# Patient Record
Sex: Female | Born: 1960 | Race: White | Hispanic: No | Marital: Married
Health system: Midwestern US, Community
[De-identification: ages and names within clinical notes are randomized; demographics above are authoritative.]

## PROBLEM LIST (undated history)

## (undated) DIAGNOSIS — Z01818 Encounter for other preprocedural examination: Secondary | ICD-10-CM

## (undated) DIAGNOSIS — T4145XA Adverse effect of unspecified anesthetic, initial encounter: Secondary | ICD-10-CM

## (undated) DIAGNOSIS — R51 Headache: Secondary | ICD-10-CM

## (undated) DIAGNOSIS — Z98811 Dental restoration status: Secondary | ICD-10-CM

## (undated) DIAGNOSIS — Z8489 Family history of other specified conditions: Secondary | ICD-10-CM

## (undated) DIAGNOSIS — T8859XA Other complications of anesthesia, initial encounter: Secondary | ICD-10-CM

## (undated) DIAGNOSIS — K219 Gastro-esophageal reflux disease without esophagitis: Secondary | ICD-10-CM

## (undated) DIAGNOSIS — R519 Headache, unspecified: Secondary | ICD-10-CM

## (undated) DIAGNOSIS — M7541 Impingement syndrome of right shoulder: Secondary | ICD-10-CM

## (undated) DIAGNOSIS — M1990 Unspecified osteoarthritis, unspecified site: Secondary | ICD-10-CM

## (undated) DIAGNOSIS — Z801 Family history of malignant neoplasm of trachea, bronchus and lung: Secondary | ICD-10-CM

## (undated) DIAGNOSIS — Z803 Family history of malignant neoplasm of breast: Secondary | ICD-10-CM

## (undated) DIAGNOSIS — M75100 Unspecified rotator cuff tear or rupture of unspecified shoulder, not specified as traumatic: Secondary | ICD-10-CM

## (undated) HISTORY — DX: Family history of malignant neoplasm of breast: Z80.3

## (undated) HISTORY — DX: Family history of malignant neoplasm of trachea, bronchus and lung: Z80.1

## (undated) HISTORY — PX: DIAGNOSTIC LAPAROSCOPY: SUR761

---

## 1993-09-14 HISTORY — PX: LUMBAR DISC SURGERY: SHX700

## 1993-09-14 HISTORY — PX: ABDOMINAL HYSTERECTOMY: SHX81

## 2001-08-25 ENCOUNTER — Encounter: Admission: RE | Admit: 2001-08-25 | Discharge: 2001-08-25 | Payer: Self-pay | Admitting: Family Medicine

## 2001-08-25 ENCOUNTER — Encounter: Payer: Self-pay | Admitting: Family Medicine

## 2002-09-04 ENCOUNTER — Encounter: Admission: RE | Admit: 2002-09-04 | Discharge: 2002-09-04 | Payer: Self-pay | Admitting: Family Medicine

## 2002-09-04 ENCOUNTER — Encounter: Payer: Self-pay | Admitting: Family Medicine

## 2003-04-10 ENCOUNTER — Other Ambulatory Visit: Admission: RE | Admit: 2003-04-10 | Discharge: 2003-04-10 | Payer: Self-pay | Admitting: Gynecology

## 2003-12-28 ENCOUNTER — Ambulatory Visit (HOSPITAL_COMMUNITY): Admission: RE | Admit: 2003-12-28 | Discharge: 2003-12-29 | Payer: Self-pay | Admitting: Neurosurgery

## 2003-12-28 HISTORY — PX: LUMBAR DISC SURGERY: SHX700

## 2004-04-24 ENCOUNTER — Other Ambulatory Visit: Admission: RE | Admit: 2004-04-24 | Discharge: 2004-04-24 | Payer: Self-pay | Admitting: Gynecology

## 2006-03-29 ENCOUNTER — Other Ambulatory Visit: Admission: RE | Admit: 2006-03-29 | Discharge: 2006-03-29 | Payer: Self-pay | Admitting: Gynecology

## 2006-12-14 ENCOUNTER — Encounter: Admission: RE | Admit: 2006-12-14 | Discharge: 2006-12-14 | Payer: Self-pay | Admitting: *Deleted

## 2007-03-09 ENCOUNTER — Ambulatory Visit (HOSPITAL_COMMUNITY): Admission: RE | Admit: 2007-03-09 | Discharge: 2007-03-09 | Payer: Self-pay | Admitting: Surgery

## 2007-03-09 ENCOUNTER — Encounter (INDEPENDENT_AMBULATORY_CARE_PROVIDER_SITE_OTHER): Payer: Self-pay | Admitting: Surgery

## 2007-03-09 HISTORY — PX: CHOLECYSTECTOMY: SHX55

## 2009-03-29 ENCOUNTER — Other Ambulatory Visit: Admission: RE | Admit: 2009-03-29 | Discharge: 2009-03-29 | Payer: Self-pay | Admitting: Family Medicine

## 2009-09-05 ENCOUNTER — Encounter: Admission: RE | Admit: 2009-09-05 | Discharge: 2009-09-05 | Payer: Self-pay | Admitting: Family Medicine

## 2010-11-03 ENCOUNTER — Other Ambulatory Visit: Payer: Self-pay | Admitting: Family Medicine

## 2010-11-03 DIAGNOSIS — Z1231 Encounter for screening mammogram for malignant neoplasm of breast: Secondary | ICD-10-CM

## 2010-11-26 ENCOUNTER — Ambulatory Visit
Admission: RE | Admit: 2010-11-26 | Discharge: 2010-11-26 | Disposition: A | Discharge status: Home / Self Care | Payer: BC Managed Care – PPO | Source: Ambulatory Visit | Attending: Family Medicine | Admitting: Family Medicine

## 2010-11-26 DIAGNOSIS — Z1231 Encounter for screening mammogram for malignant neoplasm of breast: Secondary | ICD-10-CM

## 2011-01-27 NOTE — Op Note (Signed)
NAMEMERIAN, WROE             ACCOUNT NO.:  [id number]   MEDICAL RECORD NO.:  [id number]          PATIENT TYPE:  AMB   LOCATION:  SDS                          FACILITY:  [facility]   PHYSICIAN:  Thornton Park. Daphine Deutscher, MD  DATE OF BIRTH:  May 12, 1961   DATE OF PROCEDURE:  03/09/2007  DATE OF DISCHARGE:                               OPERATIVE REPORT   PREOPERATIVE DIAGNOSIS:  Cholecystitis.   POSTOPERATIVE DIAGNOSIS:  Chronic cholecystitis.   ASSISTANT:  Young.   ANESTHESIA:  General endotracheal.   DESCRIPTION OF PROCEDURE:  This is a 50 year old white female who was  taken to room 17 on March 09, 2007, and given general anesthesia.  The  abdomen was prepped with Techni-Care and draped sterilely.  I made a  longitudinal incision down into her umbilicus.  She had a previous  transverse incision from previous laparoscopy.  I went down and then  incised her fascia and entered bluntly with a Tresa Endo and placed a Hassan  cannula in without difficulty.  The abdomen was insufflated and three  trocars were placed in the upper abdomen.  The gallbladder itself was  quite extensively walled off with adhesions around the fundus and these  were taken down sharply with scissors and then the gallbladder was  elevated and the rest of them were stripped away bluntly.   Calot's triangle was dissected free and the cystic duct was identified  cystic artery was identified.  Then I incised the cystic duct after  placing a clip proximally on the gallbladder and clips on the artery and  attempted to insert a Reddick catheter.  Unfortunately this was a small  duct and I was going right into a valve.  I attempted to inject some  contrast but did not get any to go into the common bile duct.  I  identified with a critical view the cystic duct, cystic artery and the  common duct and elected then to go ahead and triple clip the cystic  duct, divide it and then divide the cystic artery.  I did have to put an  extra  clip on that because there was a small auxiliary bleeder.  The  gallbladder was removed from the gallbladder bed using a hook  electrocautery and then was detached, placed in a bag and brought out  through the umbilicus.  She had multiple stones, the largest of which  was about a little over a centimeter in size.   The gallbladder bed was irrigated and no active bleeding was seen.  No  bile leaks were noted.  The umbilical defect was repaired with 0 Vicryl  and was checked laparoscopically to make sure that nothing was caught up  in that.  The gallbladder bed was again irrigated and looked at and  there was a little oozer that was controlled with electrocautery and  everything looked otherwise in order.   The abdomen was deflated.  Port sites were removed and the wounds were  closed 4-0 Vicryl with Benzoin and Steri-Strips.   Ms. Panozzo will be given Vicodin for pain.  I spoke with her husband  postoperatively  and asked her to come back and see me in the office in  about three to four weeks.      Thornton Park Daphine Deutscher, MD  Electronically Signed     MBM/MEDQ  D:  03/09/2007  T:  03/09/2007  Job:  [id number]

## 2011-01-30 NOTE — H&P (Signed)
NAMEMarland Kitchen  Jenna, Ward NO.:  [id number]   MEDICAL RECORD NO.:  [id number]                   PATIENT TYPE:  OIB   LOCATION:  2899                                 FACILITY:  [facility]   PHYSICIAN:  Hilda Lias, M.D.                DATE OF BIRTH:  07/21/1961   DATE OF ADMISSION:  12/28/2003  DATE OF DISCHARGE:                                HISTORY & PHYSICAL   HISTORY OF PRESENT ILLNESS:  Jenna Ward is a lady who had been seeing me in  the past because of back pain.  She came to see me yesterday as an emergency  because the day before, she developed sudden onset of back pain that went  down to the right foot associated with weakness and numbness up to the point  that she ended up in the emergency room.  Because of the amount of pain she  had, we went ahead and we did an emergency MRI yesterday and because of the  findings, she is being admitted today for surgery.   PAST MEDICAL HISTORY:  1. She has a history of chronic back pain.  2. She had a vaginal hysterectomy, laminectomy and diskectomy in 1995 and     laparoscopy.   ALLERGIES:  She is sensitive to MORPHINE and DEMEROL.   SOCIAL HISTORY:  Drinks socially.  She does not smoke.   FAMILY HISTORY:  Mother has angina.  Father died of cancer of the esophagus.  According to her, her sister has had 13 spine surgeries, 4 of them in the  neck and 7 in the lower back.   REVIEW OF SYSTEMS:  Review of systems is positive for back pain and leg  pain.   PHYSICAL EXAMINATION:  GENERAL:  On physical examination, this is a patient  who came to my office.  She was walking with a cane.  She was quite  miserable.  She was limping from the right leg.  HEENT:  Head, eyes, ears, nose and throat normal.  NECK:  Neck normal.  LUNGS:  Lungs clear.  HEART:  Heart sounds normal.  ABDOMEN:  Abdomen normal.  EXTREMITIES:  Normal pulses.  NEUROLOGIC:  Mental status normal.  Cranial nerves normal.  Strength 5/5,  except  in the right foot, where she has 2/5 weakness on dorsiflexion.  There  is numbness which involves the L5 nerve root.  Straight leg raising:  The  left side is negative at 90; the right side is positive at 10 degrees.  There is positive sciatic notch tenderness on the right side.   IMAGING STUDIES:  The MRI showed that she indeed has an herniated disk at  the level of 5-1, central and to the right.  She has some degenerative  changes and scar tissue from previous surgery below.   CLINICAL IMPRESSION:  Right L4-L5 herniated disk.   RECOMMENDATION:  The patient is being admitted for surgery.  The procedure  will be a right L4-L5 diskectomy.  The risks were explained to her including  CSF leak, worsening of the pain, paralysis and need for further surgery  which might require fusion.                                                Hilda Lias, M.D.    EB/MEDQ  D:  12/28/2003  T:  12/29/2003  Job:  [id number]

## 2011-01-30 NOTE — Op Note (Signed)
NAMESTARLEE, CORRALEJO NO.:  [id number]   MEDICAL RECORD NO.:  [id number]                   PATIENT TYPE:  OIB   LOCATION:  2899                                 FACILITY:  [facility]   PHYSICIAN:  Hilda Lias, M.D.                DATE OF BIRTH:  April 22, 1961   DATE OF PROCEDURE:  12/28/2003  DATE OF DISCHARGE:                                 OPERATIVE REPORT   PREOPERATIVE DIAGNOSIS:  Right L4-L5 herniated disk, status post L5  laminectomy.   POSTOPERATIVE DIAGNOSIS:  Right L4-L5 herniated disk, status post L5  laminectomy.   OPERATION PERFORMED:  Right L4-L5 diskectomy, removal of large fragments.  Lysis of adhesions.  Total gross diskectomy, foraminotomy.  Microscope.   SURGEON:  Hilda Lias, M.D.   ASSISTANT:  Clydene Fake, M.D.   ANESTHESIA:  General.   INDICATIONS FOR PROCEDURE:  The patient was admitted as emergency because of  back and right leg pain associated with weakness of the right foot.  The  patient many years ago underwent bilateral laminectomies.  I don't have any  history about why the procedure was done.  Nevertheless I have been seeing  this lady in my office.  She has a severe case of degenerative disk disease.  Two days ago she developed sudden onset of pain down the right leg with 2/5  weakness.  She was seen in the emergency room and later on in my office last  night.  MRI showed a large herniated disk with a fragment 4-5 on the right  side.  Surgery was advised and the risks were explained in the history and  physical.   DESCRIPTION OF PROCEDURE:  The patient was taken to the operating room and  she was positioned in a prone manner.  The incision through the previous  lumbar dissection was made.  Muscle and fascia were retracted laterally.  We  took two x-rays just to prove that we were at the level 4-5.  We brought the  microscope into the area.  The patient had quite a bit of adhesion and  adhesion was accomplished  to displace the thecal sac.  We found the L5 nerve  root which was swollen.  X-rays showed that indeed we were at the level of 4-  5.  Immediately, we lifted the nerve root and there was free fragment going  to the foramen.  There was an opening in the disk space. Incision was made  and a large amount of degenerative disk was removed.  In the end we had good  decompression for L4 and L5 nerve root.  From then on the area was  irrigated.  Valsalva maneuver was negative.  The wound was closed with  Vicryl and Steri-Strips.  Hilda Lias, M.D.    EB/MEDQ  D:  12/28/2003  T:  12/31/2003  Job:  [id number]

## 2011-07-01 LAB — BASIC METABOLIC PANEL
BUN: 11
Creatinine, Ser: 0.76
GFR calc Af Amer: >60
GFR calc non Af Amer: >60
Potassium: 4.8

## 2011-07-01 LAB — CBC
Platelets: 189
RBC: 4.07
WBC: 4.9

## 2012-06-23 ENCOUNTER — Other Ambulatory Visit: Payer: Self-pay | Admitting: Family Medicine

## 2012-06-23 DIAGNOSIS — Z1231 Encounter for screening mammogram for malignant neoplasm of breast: Secondary | ICD-10-CM

## 2012-06-23 DIAGNOSIS — Z803 Family history of malignant neoplasm of breast: Secondary | ICD-10-CM

## 2012-06-24 ENCOUNTER — Ambulatory Visit
Admission: RE | Admit: 2012-06-24 | Discharge: 2012-06-24 | Disposition: A | Discharge status: Home / Self Care | Payer: BC Managed Care – PPO | Source: Ambulatory Visit | Attending: Family Medicine | Admitting: Family Medicine

## 2012-06-24 DIAGNOSIS — Z1231 Encounter for screening mammogram for malignant neoplasm of breast: Secondary | ICD-10-CM

## 2012-06-24 DIAGNOSIS — Z803 Family history of malignant neoplasm of breast: Secondary | ICD-10-CM

## 2013-01-18 ENCOUNTER — Other Ambulatory Visit: Payer: Self-pay | Admitting: Family Medicine

## 2013-01-18 DIAGNOSIS — R1310 Dysphagia, unspecified: Secondary | ICD-10-CM

## 2013-01-20 ENCOUNTER — Other Ambulatory Visit: Payer: Self-pay | Admitting: Family Medicine

## 2013-01-20 DIAGNOSIS — R1310 Dysphagia, unspecified: Secondary | ICD-10-CM

## 2013-01-23 ENCOUNTER — Ambulatory Visit
Admission: RE | Admit: 2013-01-23 | Discharge: 2013-01-23 | Disposition: A | Discharge status: Home / Self Care | Payer: BC Managed Care – PPO | Source: Ambulatory Visit | Attending: Family Medicine | Admitting: Family Medicine

## 2013-01-23 DIAGNOSIS — R1310 Dysphagia, unspecified: Secondary | ICD-10-CM

## 2013-03-22 ENCOUNTER — Other Ambulatory Visit (HOSPITAL_COMMUNITY): Payer: Self-pay | Admitting: Family Medicine

## 2013-03-22 ENCOUNTER — Ambulatory Visit (HOSPITAL_COMMUNITY)
Admission: RE | Admit: 2013-03-22 | Discharge: 2013-03-22 | Disposition: A | Discharge status: Home / Self Care | Payer: BC Managed Care – PPO | Source: Ambulatory Visit | Attending: Family Medicine | Admitting: Family Medicine

## 2013-03-22 DIAGNOSIS — M79609 Pain in unspecified limb: Secondary | ICD-10-CM

## 2013-03-22 DIAGNOSIS — M25561 Pain in right knee: Secondary | ICD-10-CM

## 2013-03-22 DIAGNOSIS — M7989 Other specified soft tissue disorders: Secondary | ICD-10-CM | POA: Insufficient documentation

## 2013-03-22 NOTE — Progress Notes (Signed)
VASCULAR LAB PRELIMINARY  PRELIMINARY  PRELIMINARY  PRELIMINARY  Right lower extremity venous duplex completed.    Preliminary report:  Right:  No evidence of DVT, superficial thrombosis, or Baker's cyst.  Kenyada Hy, RVS 03/22/2013, 6:50 PM

## 2013-04-05 ENCOUNTER — Other Ambulatory Visit: Payer: Self-pay | Admitting: Gastroenterology

## 2013-08-23 ENCOUNTER — Other Ambulatory Visit: Payer: Self-pay

## 2013-08-23 DIAGNOSIS — Z803 Family history of malignant neoplasm of breast: Secondary | ICD-10-CM

## 2013-08-23 DIAGNOSIS — Z1231 Encounter for screening mammogram for malignant neoplasm of breast: Secondary | ICD-10-CM

## 2013-09-20 ENCOUNTER — Ambulatory Visit
Admission: RE | Admit: 2013-09-20 | Discharge: 2013-09-20 | Disposition: A | Discharge status: Home / Self Care | Payer: BC Managed Care – PPO | Source: Ambulatory Visit

## 2013-09-20 DIAGNOSIS — Z1231 Encounter for screening mammogram for malignant neoplasm of breast: Secondary | ICD-10-CM

## 2013-09-20 DIAGNOSIS — Z803 Family history of malignant neoplasm of breast: Secondary | ICD-10-CM

## 2014-11-13 HISTORY — PX: KNEE ARTHROSCOPY: SHX127

## 2015-01-13 DIAGNOSIS — M7541 Impingement syndrome of right shoulder: Secondary | ICD-10-CM

## 2015-01-13 DIAGNOSIS — M75100 Unspecified rotator cuff tear or rupture of unspecified shoulder, not specified as traumatic: Secondary | ICD-10-CM

## 2015-01-13 HISTORY — DX: Impingement syndrome of right shoulder: M75.41

## 2015-01-13 HISTORY — DX: Unspecified rotator cuff tear or rupture of unspecified shoulder, not specified as traumatic: M75.100

## 2015-01-25 ENCOUNTER — Other Ambulatory Visit: Payer: Self-pay | Admitting: Orthopaedic Surgery

## 2015-01-29 ENCOUNTER — Encounter (HOSPITAL_BASED_OUTPATIENT_CLINIC_OR_DEPARTMENT_OTHER): Payer: Self-pay | Admitting: *Deleted

## 2015-01-30 NOTE — H&P (Signed)
Jenna FillersKatheryn A Ward Ward an 54 y.o. female.   Chief Complaint: right shoulder pain HPI: Jenna MessierKathy continues with some terrible right shoulder pain. She says her knee on which we performed surgery about 6 weeks back Ward fine. The shoulder issue and the knee to some degree stems from a fall coming in a door in mid-February. She landed on the right shoulder and has had significant pain since that time. We did give her an injection which helped for a few hours. She has trouble bringing her arm up but can do so. She has a painful arc as she brings it up and down. She cannot sleep on that side at night.   MRI:  I reviewed an MRI scan films and report of a study done at the [facility] on 01/15/15. She has a full-thickness cuff tear involving a portion of the supraspinatus and infraspinatus. The biceps looks normal.  Past Medical History  Diagnosis Date  . Sinus headache   . Arthritis     knees, hands  . GERD (gastroesophageal reflux disease)   . Impingement syndrome of right shoulder 01/2015  . Rotator cuff tear 01/2015    right  . Dental crowns present   . Family history of adverse reaction to anesthesia     pt's mother had problems with hypotension and bradycardia when coming out of anesthesia - was also a redhead  . Complication of anesthesia     hypotension and bradycardia when coming out of anesthesia (one occurrence) - Ward a redhead    Past Surgical History  Procedure Laterality Date  . Knee arthroscopy Right 11/2014  . Diagnostic laparoscopy      x 2 - for endometriosis  . Cholecystectomy  03/09/2007  . Lumbar disc surgery Right 12/28/2003    L4-5  . Lumbar disc surgery  1995    L5-S1  . Abdominal hysterectomy  1995    partial    Family History  Problem Relation Age of Onset  . Anesthesia problems Mother     problems with hypotension and bradycardia when coming out of anesthesia - was a redhead   Social History:  reports that she has never smoked. She has never used smokeless tobacco. She  reports that she does not drink alcohol or use illicit drugs.  Allergies:  Allergies  Allergen Reactions  . Morphine And Related Nausea And Vomiting    No prescriptions prior to admission    No results found for this or any previous visit (from the past 48 hour(s)). No results found.  Review of Systems  Musculoskeletal: Positive for joint pain.       Right shoulder  All other systems reviewed and are negative.   Height 5\' 8"  (1.727 m), weight 91.627 kg (202 lb). Physical Exam  Constitutional: She Ward oriented to person, place, and time. She appears well-developed and well-nourished.  HENT:  Head: Normocephalic and atraumatic.  Eyes: Pupils are equal, round, and reactive to light.  Neck: Normal range of motion.  Cardiovascular: Normal rate and regular rhythm.   Respiratory: Effort normal.  GI: Soft.  Musculoskeletal:  Right shoulder motion Ward nearly full. She does have a painful arc as she brings it up and down. She has pain to cuff strength testing in both positions. She has impingement primary and secondary. There Ward no pain at her [facility] joint. Cervical motion Ward full and there Ward no palpable lymphadenopathy. Sensation and motor function are intact in her hands with palpable pulses on both sides.  Neurological: She Ward alert and oriented to person, place, and time.  Skin: Skin Ward warm and dry.  Psychiatric: She has a normal mood and affect. Her behavior Ward normal. Judgment and thought content normal.     Assessment/Plan Assessment: Right shoulder rotator cuff tear by MRI injected 12/14/14  Plan: Jenna MessierKathy Ward in need of another operation. Her knee Ward fine but her shoulder Ward terribly painful. By MRI scan she has a rotator cuff tear. The injection only helped for a few hours. Reviewed risk of anesthesia and infection related to an outpatient shoulder arthroscopy. Our plan will be to perform a conservative acromioplasty, debridement, and rotator cuff repair. I told her she needs to be  ready to be in a sling for a whole month and I stressed the importance of the postoperative rehabilitation protocol to optimize her result.   Jonathan Kirkendoll, Ginger OrganNDREW PAUL 01/30/2015, 1:21 PM

## 2015-01-31 ENCOUNTER — Other Ambulatory Visit: Payer: Self-pay | Admitting: Orthopaedic Surgery

## 2015-02-01 ENCOUNTER — Encounter (HOSPITAL_BASED_OUTPATIENT_CLINIC_OR_DEPARTMENT_OTHER): Payer: Self-pay

## 2015-02-01 ENCOUNTER — Ambulatory Visit (HOSPITAL_BASED_OUTPATIENT_CLINIC_OR_DEPARTMENT_OTHER): Payer: BC Managed Care – PPO | Admitting: Anesthesiology

## 2015-02-01 ENCOUNTER — Encounter (HOSPITAL_BASED_OUTPATIENT_CLINIC_OR_DEPARTMENT_OTHER)
Admission: RE | Disposition: A | Discharge status: Home / Self Care | Payer: Self-pay | Source: Ambulatory Visit | Attending: Orthopaedic Surgery

## 2015-02-01 ENCOUNTER — Ambulatory Visit (HOSPITAL_BASED_OUTPATIENT_CLINIC_OR_DEPARTMENT_OTHER)
Admission: RE | Admit: 2015-02-01 | Discharge: 2015-02-01 | Disposition: A | Discharge status: Home / Self Care | Payer: BC Managed Care – PPO | Source: Ambulatory Visit | Attending: Orthopaedic Surgery | Admitting: Orthopaedic Surgery

## 2015-02-01 DIAGNOSIS — Z885 Allergy status to narcotic agent status: Secondary | ICD-10-CM | POA: Diagnosis not present

## 2015-02-01 DIAGNOSIS — M19041 Primary osteoarthritis, right hand: Secondary | ICD-10-CM | POA: Insufficient documentation

## 2015-02-01 DIAGNOSIS — M19042 Primary osteoarthritis, left hand: Secondary | ICD-10-CM | POA: Insufficient documentation

## 2015-02-01 DIAGNOSIS — K219 Gastro-esophageal reflux disease without esophagitis: Secondary | ICD-10-CM | POA: Diagnosis not present

## 2015-02-01 DIAGNOSIS — M75101 Unspecified rotator cuff tear or rupture of right shoulder, not specified as traumatic: Secondary | ICD-10-CM | POA: Diagnosis present

## 2015-02-01 DIAGNOSIS — M7541 Impingement syndrome of right shoulder: Secondary | ICD-10-CM | POA: Insufficient documentation

## 2015-02-01 DIAGNOSIS — M170 Bilateral primary osteoarthritis of knee: Secondary | ICD-10-CM | POA: Diagnosis not present

## 2015-02-01 HISTORY — DX: Other complications of anesthesia, initial encounter: T88.59XA

## 2015-02-01 HISTORY — DX: Unspecified osteoarthritis, unspecified site: M19.90

## 2015-02-01 HISTORY — DX: Adverse effect of unspecified anesthetic, initial encounter: T41.45XA

## 2015-02-01 HISTORY — DX: Headache, unspecified: R51.9

## 2015-02-01 HISTORY — DX: Unspecified rotator cuff tear or rupture of unspecified shoulder, not specified as traumatic: M75.100

## 2015-02-01 HISTORY — DX: Headache: R51

## 2015-02-01 HISTORY — DX: Family history of other specified conditions: Z84.89

## 2015-02-01 HISTORY — DX: Gastro-esophageal reflux disease without esophagitis: K21.9

## 2015-02-01 HISTORY — PX: SHOULDER ARTHROSCOPY WITH ROTATOR CUFF REPAIR: SHX5685

## 2015-02-01 HISTORY — DX: Dental restoration status: Z98.811

## 2015-02-01 HISTORY — PX: SHOULDER ACROMIOPLASTY: SHX6093

## 2015-02-01 HISTORY — DX: Impingement syndrome of right shoulder: M75.41

## 2015-02-01 LAB — POCT HEMOGLOBIN-HEMACUE: HEMOGLOBIN: 13.7 g/dL (ref 12.0–15.0)

## 2015-02-01 SURGERY — ARTHROSCOPY, SHOULDER, WITH ROTATOR CUFF REPAIR
Anesthesia: General | Site: Shoulder | Laterality: Right

## 2015-02-01 MED ORDER — BUPIVACAINE-EPINEPHRINE (PF) 0.5% -1:200000 IJ SOLN
INTRAMUSCULAR | Status: DC | PRN
Start: 2015-02-01 — End: 2015-02-01
  Administered 2015-02-01: 30 mL via PERINEURAL

## 2015-02-01 MED ORDER — FENTANYL CITRATE (PF) 100 MCG/2ML IJ SOLN
INTRAMUSCULAR | Status: DC | PRN
  Administered 2015-02-01: 100 ug via INTRAVENOUS

## 2015-02-01 MED ORDER — HYDROCODONE-ACETAMINOPHEN 5-325 MG PO TABS: 1.0000 | ORAL_TABLET | Freq: Four times a day (QID) | ORAL | Status: DC | PRN

## 2015-02-01 MED ORDER — CEFAZOLIN SODIUM-DEXTROSE 2-3 GM-% IV SOLR
2.0000 g | INTRAVENOUS | Status: AC
  Administered 2015-02-01: 2 g via INTRAVENOUS

## 2015-02-01 MED ORDER — MIDAZOLAM HCL 2 MG/2ML IJ SOLN
1.0000 mg | INTRAMUSCULAR | Status: DC | PRN
  Administered 2015-02-01: 2 mg via INTRAVENOUS

## 2015-02-01 MED ORDER — LACTATED RINGERS IV SOLN
INTRAVENOUS | Status: DC
  Administered 2015-02-01: 13:00:00 via INTRAVENOUS

## 2015-02-01 MED ORDER — SUCCINYLCHOLINE CHLORIDE 20 MG/ML IJ SOLN
INTRAMUSCULAR | Status: DC | PRN
  Administered 2015-02-01: 130 mg via INTRAVENOUS

## 2015-02-01 MED ORDER — FENTANYL CITRATE (PF) 100 MCG/2ML IJ SOLN
50.0000 ug | INTRAMUSCULAR | Status: DC | PRN
  Administered 2015-02-01: 50 ug via INTRAVENOUS

## 2015-02-01 MED ORDER — CEFAZOLIN SODIUM-DEXTROSE 2-3 GM-% IV SOLR
INTRAVENOUS | Status: AC
  Filled 2015-02-01: qty 50

## 2015-02-01 MED ORDER — LACTATED RINGERS IV SOLN
INTRAVENOUS | Status: DC
  Administered 2015-02-01 (×2): via INTRAVENOUS

## 2015-02-01 MED ORDER — DEXAMETHASONE SODIUM PHOSPHATE 4 MG/ML IJ SOLN
INTRAMUSCULAR | Status: DC | PRN
  Administered 2015-02-01: 10 mg via INTRAVENOUS

## 2015-02-01 MED ORDER — SUCCINYLCHOLINE CHLORIDE 20 MG/ML IJ SOLN
INTRAMUSCULAR | Status: AC
  Filled 2015-02-01: qty 1

## 2015-02-01 MED ORDER — ONDANSETRON HCL 4 MG/2ML IJ SOLN: 4.0000 mg | Freq: Once | INTRAMUSCULAR | Status: DC | PRN

## 2015-02-01 MED ORDER — HYDROMORPHONE HCL 1 MG/ML IJ SOLN: 0.2500 mg | INTRAMUSCULAR | Status: DC | PRN

## 2015-02-01 MED ORDER — FENTANYL CITRATE (PF) 100 MCG/2ML IJ SOLN
INTRAMUSCULAR | Status: AC
  Filled 2015-02-01: qty 4

## 2015-02-01 MED ORDER — MIDAZOLAM HCL 2 MG/2ML IJ SOLN
INTRAMUSCULAR | Status: AC
  Filled 2015-02-01: qty 2

## 2015-02-01 MED ORDER — FENTANYL CITRATE (PF) 100 MCG/2ML IJ SOLN
INTRAMUSCULAR | Status: AC
Start: 2015-02-01 — End: 2015-02-01
  Filled 2015-02-01: qty 2

## 2015-02-01 MED ORDER — ONDANSETRON HCL 4 MG/2ML IJ SOLN
INTRAMUSCULAR | Status: DC | PRN
  Administered 2015-02-01: 4 mg via INTRAVENOUS

## 2015-02-01 MED ORDER — PROPOFOL 10 MG/ML IV BOLUS
INTRAVENOUS | Status: DC | PRN
  Administered 2015-02-01: 110 mg via INTRAVENOUS
  Administered 2015-02-01: 40 mg via INTRAVENOUS

## 2015-02-01 MED ORDER — SODIUM CHLORIDE 0.9 % IR SOLN
Status: DC | PRN
  Administered 2015-02-01: 6000 mL

## 2015-02-01 MED ORDER — MEPERIDINE HCL 25 MG/ML IJ SOLN: 6.2500 mg | INTRAMUSCULAR | Status: DC | PRN

## 2015-02-01 MED ORDER — CHLORHEXIDINE GLUCONATE 4 % EX LIQD: 60.0000 mL | Freq: Once | CUTANEOUS | Status: DC

## 2015-02-01 MED ORDER — DEXTROSE 5 % IV SOLN
10.0000 mg | INTRAVENOUS | Status: DC | PRN
  Administered 2015-02-01: 50 ug/min via INTRAVENOUS

## 2015-02-01 MED ORDER — SCOPOLAMINE 1 MG/3DAYS TD PT72
1.0000 | MEDICATED_PATCH | Freq: Once | TRANSDERMAL | Status: DC
  Administered 2015-02-01: 1.5 mg via TRANSDERMAL

## 2015-02-01 MED ORDER — MIDAZOLAM HCL 2 MG/2ML IJ SOLN
INTRAMUSCULAR | Status: AC
Start: 2015-02-01 — End: 2015-02-01
  Filled 2015-02-01: qty 2

## 2015-02-01 MED ORDER — EPHEDRINE SULFATE 50 MG/ML IJ SOLN
INTRAMUSCULAR | Status: DC | PRN
  Administered 2015-02-01: 10 mg via INTRAVENOUS

## 2015-02-01 MED ORDER — GLYCOPYRROLATE 0.2 MG/ML IJ SOLN: 0.2000 mg | Freq: Once | INTRAMUSCULAR | Status: DC | PRN

## 2015-02-01 SURGICAL SUPPLY — 71 items
ANCH SUT 4.5 SUBCORTICAL WNG (Anchor) ×1 IMPLANT
ANCHOR SUT POPLOK 4.5 (Anchor) ×1 IMPLANT
ANCHOR SUT POPLOK 4.5MM (Anchor) ×1 IMPLANT
APL SKNCLS STERI-STRIP NONHPOA (GAUZE/BANDAGES/DRESSINGS)
BENZOIN TINCTURE PRP APPL 2/3 (GAUZE/BANDAGES/DRESSINGS) IMPLANT
BLADE CUDA 5.5 (BLADE) IMPLANT
BLADE GREAT WHITE 4.2 (BLADE) ×2 IMPLANT
BLADE GREAT WHITE 4.2MM (BLADE) ×1
BLADE SURG 15 STRL LF DISP TIS (BLADE) IMPLANT
BLADE SURG 15 STRL SS (BLADE)
BUR VERTEX HOODED 4.5 (BURR) IMPLANT
CANNULA SHOULDER 7CM (CANNULA) ×3 IMPLANT
CANNULA TWIST IN 8.25X7CM (CANNULA) ×2 IMPLANT
CLOSURE WOUND 1/2 X4 (GAUZE/BANDAGES/DRESSINGS)
DECANTER SPIKE VIAL GLASS SM (MISCELLANEOUS) IMPLANT
DRAPE STERI 35X30 U-POUCH (DRAPES) ×5 IMPLANT
DRAPE U-SHAPE 47X51 STRL (DRAPES) ×3 IMPLANT
DRAPE U-SHAPE 76X120 STRL (DRAPES) ×6 IMPLANT
DRSG EMULSION OIL 3X3 NADH (GAUZE/BANDAGES/DRESSINGS) ×3 IMPLANT
DRSG PAD ABDOMINAL 8X10 ST (GAUZE/BANDAGES/DRESSINGS) ×3 IMPLANT
DURAPREP 26ML APPLICATOR (WOUND CARE) ×3 IMPLANT
ELECT MENISCUS 165MM 90D (ELECTRODE) IMPLANT
ELECT REM PT RETURN 9FT ADLT (ELECTROSURGICAL) ×3
ELECTRODE REM PT RTRN 9FT ADLT (ELECTROSURGICAL) ×1 IMPLANT
GAUZE SPONGE 4X4 12PLY STRL (GAUZE/BANDAGES/DRESSINGS) ×3 IMPLANT
GLOVE BIO SURGEON STRL SZ8 (GLOVE) ×6 IMPLANT
GLOVE BIOGEL PI IND STRL 8 (GLOVE) ×2 IMPLANT
GLOVE BIOGEL PI INDICATOR 8 (GLOVE) ×4
GLOVE SURG SS PI 7.0 STRL IVOR (GLOVE) ×2 IMPLANT
GOWN STRL REUS W/ TWL LRG LVL3 (GOWN DISPOSABLE) ×1 IMPLANT
GOWN STRL REUS W/ TWL XL LVL3 (GOWN DISPOSABLE) ×2 IMPLANT
GOWN STRL REUS W/TWL LRG LVL3 (GOWN DISPOSABLE) ×3
GOWN STRL REUS W/TWL XL LVL3 (GOWN DISPOSABLE) ×6
LIQUID BAND (GAUZE/BANDAGES/DRESSINGS) IMPLANT
MANIFOLD NEPTUNE II (INSTRUMENTS) ×3 IMPLANT
NDL SCORPION MULTI FIRE (NEEDLE) IMPLANT
NDL SUT 6 .5 CRC .975X.05 MAYO (NEEDLE) IMPLANT
NEEDLE MAYO TAPER (NEEDLE)
NEEDLE SCORPION MULTI FIRE (NEEDLE) ×3 IMPLANT
NS IRRIG 1000ML POUR BTL (IV SOLUTION) IMPLANT
PACK ARTHROSCOPY DSU (CUSTOM PROCEDURE TRAY) ×3 IMPLANT
PACK BASIN DAY SURGERY FS (CUSTOM PROCEDURE TRAY) ×3 IMPLANT
PASSER SUT SWANSON 36MM LOOP (INSTRUMENTS) IMPLANT
PENCIL BUTTON HOLSTER BLD 10FT (ELECTRODE) IMPLANT
SET ARTHROSCOPY TUBING (MISCELLANEOUS) ×3
SET ARTHROSCOPY TUBING LN (MISCELLANEOUS) ×1 IMPLANT
SHEET MEDIUM DRAPE 40X70 STRL (DRAPES) ×3 IMPLANT
SLEEVE SCD COMPRESS KNEE MED (MISCELLANEOUS) ×2 IMPLANT
SLING ARM LRG ADULT FOAM STRAP (SOFTGOODS) ×2 IMPLANT
SLING ARM MED ADULT FOAM STRAP (SOFTGOODS) IMPLANT
SLING ARM SM FOAM STRAP (SOFTGOODS) IMPLANT
SLING ARM XL FOAM STRAP (SOFTGOODS) IMPLANT
SPONGE LAP 4X18 X RAY DECT (DISPOSABLE) IMPLANT
STRIP CLOSURE SKIN 1/2X4 (GAUZE/BANDAGES/DRESSINGS) IMPLANT
SUCTION FRAZIER TIP 10 FR DISP (SUCTIONS) IMPLANT
SUT ETHIBOND 2 OS 4 DA (SUTURE) IMPLANT
SUT ETHILON 3 0 PS 1 (SUTURE) ×3 IMPLANT
SUT FIBERWIRE #2 38 T-5 BLUE (SUTURE)
SUT HI-FI 2 STRAND C-2 40 (SUTURE) ×2 IMPLANT
SUT PDS AB 2-0 CT2 27 (SUTURE) IMPLANT
SUT VIC AB 0 SH 27 (SUTURE) IMPLANT
SUT VIC AB 2-0 SH 27 (SUTURE)
SUT VIC AB 2-0 SH 27XBRD (SUTURE) IMPLANT
SUT VICRYL 4-0 PS2 18IN ABS (SUTURE) IMPLANT
SUTURE FIBERWR #2 38 T-5 BLUE (SUTURE) IMPLANT
SYR BULB 3OZ (MISCELLANEOUS) IMPLANT
TOWEL OR 17X24 6PK STRL BLUE (TOWEL DISPOSABLE) ×3 IMPLANT
TOWEL OR NON WOVEN STRL DISP B (DISPOSABLE) ×3 IMPLANT
WAND STAR VAC 90 (SURGICAL WAND) ×3 IMPLANT
WATER STERILE IRR 1000ML POUR (IV SOLUTION) ×3 IMPLANT
YANKAUER SUCT BULB TIP NO VENT (SUCTIONS) IMPLANT

## 2015-02-01 NOTE — Interval H&P Note (Signed)
OK for surgery PD 

## 2015-02-01 NOTE — Progress Notes (Signed)
Assisted Dr. Ossey with right, ultrasound guided, interscalene  block. Side rails up, monitors on throughout procedure. See vital signs in flow sheet. Tolerated Procedure well. 

## 2015-02-01 NOTE — Anesthesia Preprocedure Evaluation (Addendum)
Anesthesia Evaluation  Patient identified by MRN, date of birth, ID band Patient awake    Reviewed: Allergy & Precautions, NPO status , Patient's Chart, lab work & pertinent test results  History of Anesthesia Complications (+) Family history of anesthesia reaction  Airway Mallampati: I  TM Distance: >3 FB Neck ROM: Full    Dental  (+) Teeth Intact, Dental Advisory Given, Implants,    Pulmonary    Pulmonary exam normal       Cardiovascular Exercise Tolerance: Good negative cardio ROS Normal cardiovascular examRhythm:Regular Rate:Normal     Neuro/Psych  Headaches,    GI/Hepatic Neg liver ROS, GERD-  Medicated and Controlled,  Endo/Other  negative endocrine ROS  Renal/GU negative Renal ROS     Musculoskeletal  (+) Arthritis -, Osteoarthritis,    Abdominal   Peds  Hematology negative hematology ROS (+)   Anesthesia Other Findings   Reproductive/Obstetrics                           Anesthesia Physical Anesthesia Plan  ASA: II  Anesthesia Plan: General   Post-op Pain Management:    Induction: Intravenous  Airway Management Planned: Oral ETT  Additional Equipment:   Intra-op Plan:   Post-operative Plan: Extubation in OR  Informed Consent: I have reviewed the patients History and Physical, chart, labs and discussed the procedure including the risks, benefits and alternatives for the proposed anesthesia with the patient or authorized representative who has indicated his/her understanding and acceptance.     Plan Discussed with: CRNA and Surgeon  Anesthesia Plan Comments:         Anesthesia Quick Evaluation

## 2015-02-01 NOTE — Anesthesia Procedure Notes (Addendum)
Anesthesia Regional Block:  Interscalene brachial plexus block  Pre-Anesthetic Checklist: ,, timeout performed, Correct Patient, Correct Site, Correct Laterality, Correct Procedure, Correct Position, site marked, Risks and benefits discussed,  Surgical consent,  Pre-op evaluation,  At surgeon's request and post-op pain management  Laterality: Right  Prep: chloraprep       Needles:  Injection technique: Single-shot  Needle Type: Other     Needle Length: 9cm 9 cm Needle Gauge: 21 and 21 G  Needle insertion depth: 5 cm   Additional Needles:  Procedures: nerve stimulator Interscalene brachial plexus block Narrative:  Start time: 02/01/2015 12:55 PM End time: 02/01/2015 1:05 PM Injection made incrementally with aspirations every 5 mL.  Performed by: Personally  Anesthesiologist: Arta BruceSSEY, KEVIN  Additional Notes: Monitors applied. Patient sedated. Sterile prep and drape,hand hygiene and sterile gloves were used. Needle position confirmed with evoked response at 0.4 mV.Local anesthetic injected incrementally after negative aspiration.Vascular puncture avoided. No complications. The patient tolerated the procedure well.        Procedure Name: Intubation Date/Time: 02/01/2015 1:54 PM Performed by: Tyrone NineSAUVE, Earleen Aoun F Pre-anesthesia Checklist: Patient identified, Timeout performed, Emergency Drugs available, Suction available and Patient being monitored Patient Re-evaluated:Patient Re-evaluated prior to inductionOxygen Delivery Method: Circle system utilized Preoxygenation: Pre-oxygenation with 100% oxygen Intubation Type: IV induction Ventilation: Mask ventilation without difficulty Laryngoscope Size: Glidescope and 4 Grade View: Grade II Tube type: Oral Number of attempts: 1 Airway Equipment and Method: Rigid stylet and Video-laryngoscopy Placement Confirmation: ETT inserted through vocal cords under direct vision,  positive ETCO2 and breath sounds checked- equal and  bilateral Secured at: 22 cm Tube secured with: Tape Dental Injury: Teeth and Oropharynx as per pre-operative assessment  Difficulty Due To: Difficulty was anticipated, Difficult Airway- due to anterior larynx and Difficult Airway- due to dentition Future Recommendations: Recommend- induction with short-acting agent, and alternative techniques readily available Comments: 2 FB to TM with visual evaluation . Elective Video laryngoscopy

## 2015-02-01 NOTE — Discharge Instructions (Signed)
° ° °  Regional Anesthesia Blocks ° °1. Numbness or the inability to move the "blocked" extremity may last from 3-48 hours after placement. The length of time depends on the medication injected and your individual response to the medication. If the numbness is not going away after 48 hours, call your surgeon. ° °2. The extremity that is blocked will need to be protected until the numbness is gone and the  Strength has returned. Because you cannot feel it, you will need to take extra care to avoid injury. Because it may be weak, you may have difficulty moving it or using it. You may not know what position it is in without looking at it while the block is in effect. ° °3. For blocks in the legs and feet, returning to weight bearing and walking needs to be done carefully. You will need to wait until the numbness is entirely gone and the strength has returned. You should be able to move your leg and foot normally before you try and bear weight or walk. You will need someone to be with you when you first try to ensure you do not fall and possibly risk injury. ° °4. Bruising and tenderness at the needle site are common side effects and will resolve in a few days. ° °5. Persistent numbness or new problems with movement should be communicated to the surgeon or the [facility] ([phone])/ [facility] ([phone]). ° ° ° °Post Anesthesia Home Care Instructions ° °Activity: °Get plenty of rest for the remainder of the day. A responsible adult should stay with you for 24 hours following the procedure.  °For the next 24 hours, DO NOT: °-Drive a car °-Operate machinery °-Drink alcoholic beverages °-Take any medication unless instructed by your physician °-Make any legal decisions or sign important papers. ° °Meals: °Start with liquid foods such as gelatin or soup. Progress to regular foods as tolerated. Avoid greasy, spicy, heavy foods. If nausea and/or vomiting occur, drink only clear liquids until  the nausea and/or vomiting subsides. Call your physician if vomiting continues. ° °Special Instructions/Symptoms: °Your throat may feel dry or sore from the anesthesia or the breathing tube placed in your throat during surgery. If this causes discomfort, gargle with warm salt water. The discomfort should disappear within 24 hours. ° °If you had a scopolamine patch placed behind your ear for the management of post- operative nausea and/or vomiting: ° °1. The medication in the patch is effective for 72 hours, after which it should be removed.  Wrap patch in a tissue and discard in the trash. Wash hands thoroughly with soap and water. °2. You may remove the patch earlier than 72 hours if you experience unpleasant side effects which may include dry mouth, dizziness or visual disturbances. °3. Avoid touching the patch. Wash your hands with soap and water after contact with the patch. °  ° °

## 2015-02-01 NOTE — Op Note (Signed)
#  [id number] 

## 2015-02-01 NOTE — Anesthesia Postprocedure Evaluation (Signed)
Anesthesia Post Note  Patient: Jenna Ward  Procedure(s) Performed: Procedure(s) (LRB): RIGHT SHOULDER ARTHROSCOPY WITH ROTATOR CUFF REPAIR (Right) SHOULDER ACROMIOPLASTY (Right)  Anesthesia type: general  Patient location: PACU  Post pain: Pain level controlled  Post assessment: Patient's Cardiovascular Status Stable  Last Vitals:  Filed Vitals:   02/01/15 1615  BP: 150/77  Pulse: 67  Temp: 36.4 C  Resp: 16    Post vital signs: Reviewed and stable  Level of consciousness: sedated  Complications: No apparent anesthesia complications

## 2015-02-01 NOTE — Transfer of Care (Signed)
Immediate Anesthesia Transfer of Care Note  Patient: Garlan FillersKatheryn A Trentman  Procedure(s) Performed: Procedure(s) with comments: RIGHT SHOULDER ARTHROSCOPY WITH ROTATOR CUFF REPAIR (Right) - ANESTHESIA:  GENERAL/INTERSCALENE BLOCK SHOULDER ACROMIOPLASTY (Right)  Patient Location: PACU  Anesthesia Type:GA combined with regional for post-op pain  Level of Consciousness: sedated  Airway & Oxygen Therapy: Patient Spontanous Breathing and Patient connected to face mask oxygen  Post-op Assessment: Report given to RN and Post -op Vital signs reviewed and stable  Post vital signs: Reviewed and stable  Last Vitals:  Filed Vitals:   02/01/15 1504  BP:   Pulse: 84  Temp:   Resp:     Complications: No apparent anesthesia complications

## 2015-02-04 ENCOUNTER — Encounter (HOSPITAL_BASED_OUTPATIENT_CLINIC_OR_DEPARTMENT_OTHER): Payer: Self-pay | Admitting: Orthopaedic Surgery

## 2015-02-04 NOTE — Op Note (Signed)
[name], [name]:  [id number]  MEDICAL RECORD NO.:  [id number]  LOCATION:                                FACILITY:  [facility]  PHYSICIAN:  Lubertha Basque. Dianne Bady, M.D.DATE OF BIRTH:  03-Nov-1960  DATE OF PROCEDURE:  02/01/2015 DATE OF DISCHARGE:  02/01/2015                              OPERATIVE REPORT   PREOPERATIVE DIAGNOSES: 1. Right shoulder impingement. 2. Right shoulder rotator cuff tear.  POSTOPERATIVE DIAGNOSES: 1. Right shoulder impingement. 2. Right shoulder rotator cuff tear.  PROCEDURES: 1. Right shoulder arthroscopic acromioplasty. 2. Right shoulder arthroscopic debridement. 3. Right shoulder arthroscopic rotator cuff repair.  ANESTHESIA:  General and block.  ATTENDING SURGEON:  Lubertha Basque. Jenna Ward, M.D.  ASSISTANT:  Elodia Florence, PA.  INDICATION FOR PROCEDURE:  The patient is a 54 year old woman, who fell about 3 months ago and injured her knee and her shoulder.  She is status post a successful knee arthroscopy and at this point, has continued right shoulder pain.  By MRI scan, she has a small rotator cuff tear. She has inability to use her arm out to the side or overhead and cannot sleep at night.  She is offered an arthroscopy with cuff repair. Informed operative consent was obtained after discussion of possible complications including reaction to anesthesia and infection.  SUMMARY OF FINDINGS AND PROCEDURE:  Under general anesthesia and a block, a right shoulder arthroscopy was performed.  Glenohumeral joint showed no degenerative changes and the biceps tendon looked normal.  She had a supraspinatus rotator cuff tear, which she could easily see from below.  This was confirmed in the subacromial space and was about a cm in size and just mildly retracted.  The tissues were good.  I performed a conservative acromioplasty followed by arthroscopic repair of the cuff with 2 inverted mattress sutures secured with 1 anchor.  Elodia Florence assisted  throughout and was invaluable to the completion of the case mostly in that he passed instruments and made this possible in an arthroscopic fashion.  DESCRIPTION OF PROCEDURE:  The patient was taken to the operating suite where general anesthetic was applied without difficulty.  She was also given a block in the pre-anesthesia area.  She was positioned in a beach- chair position and prepped and draped in normal sterile fashion.  After the administration of IV Kefzol and an appropriate time-out, an arthroscopy of the right shoulder was performed through total of 4 portals.  Findings were as noted above and procedure consisted initially of the acromioplasty, which was done with a burr in the lateral position followed by transfer the burr to the posterior position.  I then debrided some devitalized tissues and created a bleeding bed of bone for a repair.  We passed 2 inverted mattress sutures of Hi-Fi suture through the cuff.  We then utilized these to secure the rotator cuff back to the greater tuberosity region over the bleeding bed of bone with 1 PopLok anchor by Linvatec.  This gave [place] a nice tight repair.  The shoulder was thoroughly irrigated followed by reapproximation of portals loosely with nylon.  Adaptic was applied followed by dry gauze and tape.  Estimated blood loss and fluids  can be obtained from anesthesia records.  DISPOSITION:  The patient was extubated in operating room and taken to recovery room in stable condition.  She was to go home same day and follow up in the office less than a week.  I will contact her by phone tonight.     Lubertha BasquePeter G. Jenna Santosalldorf, M.D.     PGD/MEDQ  D:  02/01/2015  T:  02/02/2015  Job:  [id number]

## 2015-02-12 ENCOUNTER — Other Ambulatory Visit: Payer: Self-pay

## 2015-02-12 DIAGNOSIS — Z1231 Encounter for screening mammogram for malignant neoplasm of breast: Secondary | ICD-10-CM

## 2015-03-25 ENCOUNTER — Ambulatory Visit: Payer: BC Managed Care – PPO

## 2015-04-19 ENCOUNTER — Ambulatory Visit
Admission: RE | Admit: 2015-04-19 | Discharge: 2015-04-19 | Disposition: A | Discharge status: Home / Self Care | Payer: BC Managed Care – PPO | Source: Ambulatory Visit

## 2015-04-19 DIAGNOSIS — Z1231 Encounter for screening mammogram for malignant neoplasm of breast: Secondary | ICD-10-CM

## 2015-11-01 ENCOUNTER — Encounter (HOSPITAL_COMMUNITY): Payer: Self-pay | Admitting: Emergency Medicine

## 2015-11-01 ENCOUNTER — Emergency Department (HOSPITAL_COMMUNITY): Payer: BC Managed Care – PPO

## 2015-11-01 ENCOUNTER — Emergency Department (HOSPITAL_COMMUNITY)
Admission: EM | Admit: 2015-11-01 | Discharge: 2015-11-01 | Disposition: A | Discharge status: Home / Self Care | Payer: BC Managed Care – PPO | Attending: Emergency Medicine | Admitting: Emergency Medicine

## 2015-11-01 DIAGNOSIS — Z79899 Other long term (current) drug therapy: Secondary | ICD-10-CM | POA: Insufficient documentation

## 2015-11-01 DIAGNOSIS — Z7951 Long term (current) use of inhaled steroids: Secondary | ICD-10-CM | POA: Insufficient documentation

## 2015-11-01 DIAGNOSIS — R0602 Shortness of breath: Secondary | ICD-10-CM | POA: Insufficient documentation

## 2015-11-01 DIAGNOSIS — R519 Headache, unspecified: Secondary | ICD-10-CM

## 2015-11-01 DIAGNOSIS — K219 Gastro-esophageal reflux disease without esophagitis: Secondary | ICD-10-CM | POA: Diagnosis not present

## 2015-11-01 DIAGNOSIS — R51 Headache: Secondary | ICD-10-CM | POA: Insufficient documentation

## 2015-11-01 DIAGNOSIS — M158 Other polyosteoarthritis: Secondary | ICD-10-CM | POA: Diagnosis not present

## 2015-11-01 LAB — BASIC METABOLIC PANEL
ANION GAP: 10 (ref 5–15)
BUN: 20 mg/dL (ref 6–20)
CO2: 26 mmol/L (ref 22–32)
Calcium: 9.5 mg/dL (ref 8.9–10.3)
Chloride: 106 mmol/L (ref 101–111)
Creatinine, Ser: 0.61 mg/dL (ref 0.44–1.00)
GFR calc Af Amer: >60 mL/min (ref >60)
GFR calc non Af Amer: >60 mL/min (ref >60)
Glucose, Bld: 107 mg/dL — ABNORMAL HIGH (ref 65–99)
Potassium: 4 mmol/L (ref 3.5–5.1)
Sodium: 142 mmol/L (ref 135–145)

## 2015-11-01 LAB — CBC WITH DIFFERENTIAL/PLATELET
BASOS ABS: 0 10*3/uL (ref 0.0–0.1)
Basophils Relative: 0 %
EOS PCT: 2 %
Eosinophils Absolute: 0.1 10*3/uL (ref 0.0–0.7)
HCT: 39.3 % (ref 36.0–46.0)
Hemoglobin: 12.8 g/dL (ref 12.0–15.0)
LYMPHS PCT: 23 %
Lymphs Abs: 1.1 10*3/uL (ref 0.7–4.0)
MCH: 31.6 pg (ref 26.0–34.0)
MCHC: 32.6 g/dL (ref 30.0–36.0)
MCV: 97 fL (ref 78.0–100.0)
Monocytes Absolute: 0.3 10*3/uL (ref 0.1–1.0)
Monocytes Relative: 5 %
Neutro Abs: 3.4 10*3/uL (ref 1.7–7.7)
Neutrophils Relative %: 70 %
PLATELETS: 198 10*3/uL (ref 150–400)
RBC: 4.05 MIL/uL (ref 3.87–5.11)
RDW: 13.4 % (ref 11.5–15.5)
WBC: 4.9 10*3/uL (ref 4.0–10.5)

## 2015-11-01 LAB — I-STAT TROPONIN, ED
TROPONIN I, POC: 0 ng/mL (ref 0.00–0.08)
Troponin i, poc: 0 ng/mL (ref 0.00–0.08)

## 2015-11-01 MED ORDER — PROMETHAZINE HCL 25 MG/ML IJ SOLN
25.0000 mg | Freq: Once | INTRAMUSCULAR | Status: AC
  Administered 2015-11-01: 25 mg via INTRAVENOUS
  Filled 2015-11-01: qty 1

## 2015-11-01 MED ORDER — SODIUM CHLORIDE 0.9 % IV BOLUS (SEPSIS)
1000.0000 mL | Freq: Once | INTRAVENOUS | Status: AC
  Administered 2015-11-01: 1000 mL via INTRAVENOUS

## 2015-11-01 MED ORDER — IOHEXOL 300 MG/ML  SOLN: 25.0000 mL | INTRAMUSCULAR | Status: DC

## 2015-11-01 MED ORDER — DIPHENHYDRAMINE HCL 50 MG/ML IJ SOLN
25.0000 mg | Freq: Once | INTRAMUSCULAR | Status: AC
  Administered 2015-11-01: 25 mg via INTRAVENOUS
  Filled 2015-11-01: qty 1

## 2015-11-01 MED ORDER — IOHEXOL 300 MG/ML  SOLN: 25.0000 mL | Freq: Once | INTRAMUSCULAR | Status: DC | PRN

## 2015-11-01 NOTE — ED Provider Notes (Signed)
CSN: [id number]     Arrival date & time 11/01/15  1125 History   First MD Initiated Contact with Patient 11/01/15 1345     Chief Complaint  Patient presents with  . Shortness of Breath    HPI   Jenna Ward is an 54 y.o. female with history of GERD, arthritis who presents to the ED for evaluation of chest pain. She states yesterday afternoon she finished working out and was resting at home when she started feeling a dull, left-sided chest pressure. She states it was not a sharp pain but felt like someone was squeezing or pressing on her chest. States she has frequent hot flashes/sweats as she is perimenopausal but denies diaphoresis. She states that this morning when she woke up she felt like the chest pressure was still there and noticed a new left neck pain. She states the pain feels like it travels up from her chest through her neck, and it is a sharp cramping pain. SHe states it feels like she might have slept on it wrong. States she also lifts weights on Thursdays (not heavy since she had rotator cuff surgery) but is not sure if she is sore from that. States she went to [facility] today to check her BP and it was 140s/90s which is "very high" for her so she came to the ED. She also reports a diffuse frontal throbbing headache for the past few days. States it was of gradual onset and has progressively gotten worse. Reports associated photophobia. Reports no relief with advil/tylenol. Denies dizziness, feeling faint, weakness, n/v/d. Denies fam hx of significant cardiac events. She does report increased stress at work lately.   Past Medical History  Diagnosis Date  . Sinus headache   . Arthritis     knees, hands  . GERD (gastroesophageal reflux disease)   . Impingement syndrome of right shoulder 01/2015  . Rotator cuff tear 01/2015    right  . Dental crowns present   . Family history of adverse reaction to anesthesia     pt's mother had problems with hypotension and bradycardia when coming out of  anesthesia - was also a redhead  . Complication of anesthesia     hypotension and bradycardia when coming out of anesthesia (one occurrence) - is a redhead   Past Surgical History  Procedure Laterality Date  . Knee arthroscopy Right 11/2014  . Diagnostic laparoscopy      x 2 - for endometriosis  . Cholecystectomy  03/09/2007  . Lumbar disc surgery Right 12/28/2003    L4-5  . Lumbar disc surgery  1995    L5-S1  . Abdominal hysterectomy  1995    partial  . Shoulder arthroscopy with rotator cuff repair Right 02/01/2015    Procedure: RIGHT SHOULDER ARTHROSCOPY WITH ROTATOR CUFF REPAIR;  Surgeon: Marcene Corning, MD;  Location: [facility];  Service: Orthopedics;  Laterality: Right;  ANESTHESIA:  GENERAL/INTERSCALENE BLOCK  . Shoulder acromioplasty Right 02/01/2015    Procedure: SHOULDER ACROMIOPLASTY;  Surgeon: Marcene Corning, MD;  Location: [facility];  Service: Orthopedics;  Laterality: Right;   Family History  Problem Relation Age of Onset  . Anesthesia problems Mother     problems with hypotension and bradycardia when coming out of anesthesia - was a redhead   Social History  Substance Use Topics  . Smoking status: Never Smoker   . Smokeless tobacco: Never Used  . Alcohol Use: No   OB History    No data available  Review of Systems  All other systems reviewed and are negative.     Allergies  Morphine and related  Home Medications   Prior to Admission medications   Medication Sig Start Date End Date Taking? Authorizing Provider  calcium carbonate (OS-CAL) 600 MG TABS tablet Take 600 mg by mouth 2 (two) times daily with a meal.    Historical Provider, MD  cholecalciferol (VITAMIN D) 1000 UNITS tablet Take 1,000 Units by mouth daily.    Historical Provider, MD  fexofenadine (ALLEGRA) 180 MG tablet Take 180 mg by mouth daily.    Historical Provider, MD  glucosamine-chondroitin 500-400 MG tablet Take 1 tablet by mouth 2 (two) times daily.     Historical Provider, MD  HYDROcodone-acetaminophen (NORCO) 5-325 MG per tablet Take 1-2 tablets by mouth every 6 (six) hours as needed for moderate pain. 02/01/15   Jenna Florence, PA-C  Multiple Vitamin (MULTIVITAMIN) tablet Take 1 tablet by mouth daily.    Historical Provider, MD  omeprazole (PRILOSEC) 40 MG capsule Take 40 mg by mouth daily.    Historical Provider, MD  QUEtiapine (SEROQUEL) 100 MG tablet Take 100 mg by mouth at bedtime.    Historical Provider, MD  triamcinolone (NASACORT) 55 MCG/ACT AERO nasal inhaler Place 2 sprays into the nose daily.    Historical Provider, MD  venlafaxine XR (EFFEXOR-XR) 75 MG 24 hr capsule Take 75 mg by mouth daily with breakfast.    Historical Provider, MD   BP 138/92 mmHg  Pulse 79  Temp(Src) 97.5 F (36.4 C) (Oral)  Resp 12  SpO2 96% Physical Exam  Constitutional: She is oriented to person, place, and time.  HENT:  Right Ear: External ear normal.  Left Ear: External ear normal.  Nose: Nose normal.  Mouth/Throat: Oropharynx is clear and moist. No oropharyngeal exudate.  No temporal or frontal TTP.   Eyes: Conjunctivae and EOM are normal. Pupils are equal, round, and reactive to light.  No nystagmus  Neck: Normal range of motion. Neck supple.    Lateral L neck TTP. FROM. No rigidity.   Cardiovascular: Normal rate, regular rhythm, normal heart sounds and intact distal pulses.   Pulmonary/Chest: Effort normal and breath sounds normal. No respiratory distress. She has no wheezes. She exhibits no tenderness.  Abdominal: Soft. Bowel sounds are normal. She exhibits no distension. There is no tenderness. There is no rebound and no guarding.  Musculoskeletal: She exhibits no edema.  Neurological: She is alert and oriented to person, place, and time. She has normal strength. No cranial nerve deficit or sensory deficit. Coordination and gait normal.  Skin: Skin is warm and dry.  Psychiatric: She has a normal mood and affect.  Nursing note and vitals  reviewed.  Filed Vitals:   11/01/15 1500 11/01/15 1547 11/01/15 1600 11/01/15 1640  BP: 143/97 145/72 121/76   Pulse:  53  56  Temp:      TempSrc:      Resp:  18    SpO2:  100%  99%     ED Course  Procedures (including critical care time) Labs Review Labs Reviewed  BASIC METABOLIC PANEL - Abnormal; Notable for the following:    Glucose, Bld 107 (*)    All other components within normal limits  CBC WITH DIFFERENTIAL/PLATELET  I-STAT TROPOININ, ED  Rosezena Sensor, ED    Imaging Review Dg Chest 2 View  11/01/2015  CLINICAL DATA:  55 year old with intermittent mid to left-sided chest pain shortness of breath that began last night. EXAM: CHEST  2 VIEW COMPARISON:  None. FINDINGS: Cardiomediastinal silhouette unremarkable. Lungs clear. Bronchovascular markings normal. Pulmonary vascularity normal. No visible pleural effusions. No pneumothorax. Mild degenerative changes involving the thoracic spine. IMPRESSION: No acute cardiopulmonary disease. Electronically Signed   By: Hulan Saas M.D.   On: 11/01/2015 12:44   I have personally reviewed and evaluated these images and lab results as part of my medical decision-making.   EKG Interpretation   Date/Time:  Friday November 01 2015 11:32:50 EST Ventricular Rate:  79 PR Interval:  148 QRS Duration: 100 QT Interval:  378 QTC Calculation: 433 R Axis:   52 Text Interpretation:  Sinus rhythm Multiple ventricular premature  complexes Low voltage, precordial leads agree. no STEMI. Confirmed by  Donnald Garre, MD, Lebron Conners [phone]) on 11/01/2015 2:24:57 PM      MDM   Final diagnoses:  Shortness of breath  Headache, unspecified headache type    HEART score 1. Will delta troponin and anticipate d/c home. She has no risk factors for dissection, her BP is unremarkable. Her neuro exam is completely intact. Will hold off on any brain imaging including CTA head/neck for now. Will give headache cocktail of 1L NS bolus, phenergan, and  benadryl.  Pt reports improvement in headache with meds as above. She is able to ambulate asymptomatically with no oxygen de-sat or hypotension. Will sign out to oncoming team as pt finishing IV fluids. Anticipate d/c home with PCP f/u, with strict ER return precautions given.     Carlene Coria, PA-C 11/01/15 2037  Arby Barrette, MD 11/02/15 [phone]

## 2015-11-01 NOTE — ED Notes (Signed)
Per pt, states she was having jaw pain and pain in between shoulder blades-states she doesn't feel like she can catch her breath-states she went to [facility] and checked BP and it was high

## 2015-11-01 NOTE — Discharge Instructions (Signed)
Jenna BELLVILLECooper,  Please follow-up with your primary care provider. Return to the emergency department if you develop chest pain, increased shortness of breath, increased headache, slurred speech, weakness in your arms or legs, facial droop. Feel better soon!  Noelle Penner, PA-C  General Headache Without Cause A headache is pain or discomfort felt around the head or neck area. The specific cause of a headache may not be found. There are many causes and types of headaches. A few common ones are:  Tension headaches.  Migraine headaches.  Cluster headaches.  Chronic daily headaches. HOME CARE INSTRUCTIONS  Watch your condition for any changes. Take these steps to help with your condition: Managing Pain  Take over-the-counter and prescription medicines only as told by your health care provider.  Lie down in a dark, quiet room when you have a headache.  If directed, apply ice to the head and neck area:  Put ice in a plastic bag.  Place a towel between your skin and the bag.  Leave the ice on for 20 minutes, 2-3 times per day.  Use a heating pad or hot shower to apply heat to the head and neck area as told by your health care provider.  Keep lights dim if bright lights bother you or make your headaches worse. Eating and Drinking  Eat meals on a regular schedule.  Limit alcohol use.  Decrease the amount of caffeine you drink, or stop drinking caffeine. General Instructions  Keep all follow-up visits as told by your health care provider. This is important.  Keep a headache journal to help find out what may trigger your headaches. For example, write down:  What you eat and drink.  How much sleep you get.  Any change to your diet or medicines.  Try massage or other relaxation techniques.  Limit stress.  Sit up straight, and do not tense your muscles.  Do not use tobacco products, including cigarettes, chewing tobacco, or e-cigarettes. If you need help quitting,  ask your health care provider.  Exercise regularly as told by your health care provider.  Sleep on a regular schedule. Get 7-9 hours of sleep, or the amount recommended by your health care provider. SEEK MEDICAL CARE IF:   Your symptoms are not helped by medicine.  You have a headache that is different from the usual headache.  You have nausea or you vomit.  You have a fever. SEEK IMMEDIATE MEDICAL CARE IF:   Your headache becomes severe.  You have repeated vomiting.  You have a stiff neck.  You have a loss of vision.  You have problems with speech.  You have pain in the eye or ear.  You have muscular weakness or loss of muscle control.  You lose your balance or have trouble walking.  You feel faint or pass out.  You have confusion.   This information is not intended to replace advice given to you by your health care provider. Make sure you discuss any questions you have with your health care provider.   Document Released: 08/31/2005 Document Revised: 05/22/2015 Document Reviewed: 12/24/2014 Elsevier Interactive Patient Education [employer]

## 2015-11-01 NOTE — ED Provider Notes (Signed)
Care handoff from [place], [place] at shift change. Patient being treated with headache cocktail. She may be safely discharged, as her headache is improving.   Melton Krebs, PA-C 11/01/15 1557  Lavera Guise, MD 11/01/15 [phone]

## 2016-03-09 ENCOUNTER — Other Ambulatory Visit: Payer: Self-pay | Admitting: Gastroenterology

## 2016-07-14 ENCOUNTER — Other Ambulatory Visit: Payer: Self-pay | Admitting: Family Medicine

## 2016-07-14 DIAGNOSIS — Z1231 Encounter for screening mammogram for malignant neoplasm of breast: Secondary | ICD-10-CM

## 2016-08-05 ENCOUNTER — Ambulatory Visit
Admission: RE | Admit: 2016-08-05 | Discharge: 2016-08-05 | Disposition: A | Discharge status: Home / Self Care | Payer: BC Managed Care – PPO | Source: Ambulatory Visit | Attending: Family Medicine | Admitting: Family Medicine

## 2016-08-05 DIAGNOSIS — Z1231 Encounter for screening mammogram for malignant neoplasm of breast: Secondary | ICD-10-CM

## 2017-05-05 ENCOUNTER — Other Ambulatory Visit: Payer: Self-pay | Admitting: Family Medicine

## 2017-05-05 ENCOUNTER — Other Ambulatory Visit (HOSPITAL_COMMUNITY)
Admission: RE | Admit: 2017-05-05 | Discharge: 2017-05-05 | Disposition: A | Discharge status: Home / Self Care | Payer: BC Managed Care – PPO | Source: Ambulatory Visit | Attending: Family Medicine | Admitting: Family Medicine

## 2017-05-05 DIAGNOSIS — Z124 Encounter for screening for malignant neoplasm of cervix: Secondary | ICD-10-CM | POA: Insufficient documentation

## 2017-05-10 LAB — CYTOLOGY - PAP
Chlamydia: NEGATIVE
HPV: NOT DETECTED
NEISSERIA GONORRHEA: NEGATIVE

## 2017-05-31 ENCOUNTER — Telehealth: Payer: Self-pay

## 2017-05-31 NOTE — Telephone Encounter (Signed)
Attempted to contact pt to see if she would be continuing care with Dr. Tomie China so that she could get her medication refilled through [place]. LM to call back.

## 2017-06-28 ENCOUNTER — Telehealth: Payer: Self-pay | Admitting: Genetic Counselor

## 2017-06-28 ENCOUNTER — Encounter: Payer: Self-pay | Admitting: Genetic Counselor

## 2017-06-28 NOTE — Telephone Encounter (Signed)
Genetic counseling appt has been scheduled for the pt to see Maylon Cos on 11/21 at 9am. Pt aware to arrive 15 minutes early. Letter mailed.

## 2017-08-03 ENCOUNTER — Encounter: Payer: Self-pay | Admitting: Genetics

## 2017-08-04 ENCOUNTER — Encounter: Payer: Self-pay | Admitting: Genetics

## 2017-08-04 ENCOUNTER — Ambulatory Visit (HOSPITAL_BASED_OUTPATIENT_CLINIC_OR_DEPARTMENT_OTHER): Payer: BC Managed Care – PPO | Admitting: Genetics

## 2017-08-04 ENCOUNTER — Other Ambulatory Visit: Payer: BC Managed Care – PPO

## 2017-08-04 DIAGNOSIS — Z801 Family history of malignant neoplasm of trachea, bronchus and lung: Secondary | ICD-10-CM | POA: Insufficient documentation

## 2017-08-04 DIAGNOSIS — Z8481 Family history of carrier of genetic disease: Secondary | ICD-10-CM

## 2017-08-04 DIAGNOSIS — Z7183 Encounter for nonprocreative genetic counseling: Secondary | ICD-10-CM | POA: Diagnosis not present

## 2017-08-04 DIAGNOSIS — Z803 Family history of malignant neoplasm of breast: Secondary | ICD-10-CM | POA: Diagnosis not present

## 2017-08-04 NOTE — Progress Notes (Signed)
REFERRING PROVIDER: Thurnell Lose, MD [address] [place], [place] [address]  PRIMARY PROVIDER:  Rankins, Bill Salinas, MD  PRIMARY REASON FOR VISIT:  1. Family history of genetic disease carrier   2. Family history of lung cancer   3. Family history of breast cancer     HISTORY OF PRESENT ILLNESS:   [name], a 56 y.o. female, was seen for a [place] cancer genetics consultation at the request of Dr. Simona Huh due to a family history of cancer.  [name] presents to clinic today to discuss the possibility of a hereditary predisposition to cancer, genetic testing, and to further clarify her future cancer risks, as well as potential cancer risks for family members.   [name] is a 56 y.o. female with no personal history of cancer.     HORMONAL RISK FACTORS:  Menarche was at age 59.  First live birth at age 51.  Ovaries intact: yes.  Hysterectomy: yes. Partial due to fibroids and endometriosis Menopausal status: postmenopausal. Menopause in 1995 (33 years) Colonoscopy: yes; reportedly normal.  She reports she has had several upper endoscopies due to a history of GERD and those have not identified any polyps, precancerous lesion, etc. Mammogram within the last year: yes. 2017.   Past Medical History:  Diagnosis Date  . Arthritis    knees, hands  . Complication of anesthesia    hypotension and bradycardia when coming out of anesthesia (one occurrence) - is a redhead  . Dental crowns present   . Family history of adverse reaction to anesthesia    pt's mother had problems with hypotension and bradycardia when coming out of anesthesia - was also a redhead  . Family history of breast cancer   . Family history of lung cancer   . GERD (gastroesophageal reflux disease)   . Impingement syndrome of right shoulder 01/2015  . Rotator cuff tear 01/2015   right  . Sinus headache     Past Surgical History:  Procedure Laterality Date  . ABDOMINAL HYSTERECTOMY  1995   partial  . CHOLECYSTECTOMY  03/09/2007  . DIAGNOSTIC LAPAROSCOPY     x 2 - for endometriosis  . KNEE ARTHROSCOPY Right 11/2014  . LUMBAR DISC SURGERY Right 12/28/2003   L4-5  . [place]   L5-S1  . SHOULDER ACROMIOPLASTY Right 02/01/2015   Procedure: SHOULDER ACROMIOPLASTY;  Surgeon: Melrose Nakayama, MD;  Location: [facility];  Service: Orthopedics;  Laterality: Right;  . SHOULDER ARTHROSCOPY WITH ROTATOR CUFF REPAIR Right 02/01/2015   Procedure: RIGHT SHOULDER ARTHROSCOPY WITH ROTATOR CUFF REPAIR;  Surgeon: Melrose Nakayama, MD;  Location: [facility];  Service: Orthopedics;  Laterality: Right;  ANESTHESIA:  GENERAL/INTERSCALENE BLOCK    Social History   Socioeconomic History  . Marital status: Married    Spouse name: None  . Number of children: None  . Years of education: None  . Highest education level: None  Social Needs  . Financial resource strain: None  . Food insecurity - worry: None  . Food insecurity - inability: None  . Transportation needs - medical: None  . Transportation needs - non-medical: None  Occupational History  . None  Tobacco Use  . Smoking status: Never Smoker  . Smokeless tobacco: Never Used  Substance and Sexual Activity  . Alcohol use: No  . Drug use: No  . Sexual activity: None  Other Topics Concern  . None  Social History Narrative  . None  FAMILY HISTORY:  We obtained a detailed, 4-generation family history.  Significant diagnoses are listed below: Family History  Problem Relation Age of Onset  . Anesthesia problems Mother        problems with hypotension and bradycardia when coming out of anesthesia - was a redhead  . CAD Mother   . Angina Mother   . COPD Mother   . Esophageal cancer Father        dx. 14's, hx smoking  . Hypertension Father   . Heart disease Father   . Lung cancer Sister 20       hx smoking, lived in [place] (reporedly highest radon exposure state)  . Lung cancer Brother 70        hx smoking  . Hypertension Brother   . Breast cancer Maternal Grandmother        dx >50  . Heart attack Maternal Grandfather 60  . Breast cancer Paternal Grandmother        dx >50  . Breast cancer Sister 27  . Melanoma Sister 25  . Hypertension Sister   . Hypertension Brother   . Breast cancer Other 10       GT BRCA2 Variant of uncertain significance. [id number]  . Other Daughter        heterozygous (1 mutation) MUTYH- (MUTYH assocaited with recessive colon cancer/polyps risk)   [name] has a 49 year-old daughter who has no history of cancer and who has 2 sons and a daughter.  This daughter had genetic testing recently that identified a MUTYH heterozygous c.1187G>A [id number] mutation.  [name] also has a 28 year-old son.    [name] has 3 sisters and 2 brothers listed below: -1 sister is 54 with no history of cancer.  She had a daughter (patient's niece) who was diagnosed with breast cancer at 69).  She had genetic testing that according to patient report revealed a BRCA2 mtuation.  [name] did not have a copy of the actual report, but had a detailed written description of the family history from her daughter (who had genetic testing recently) that reads the following: "Niece: BRCA2 mutaion: [phone] (change from Threonine to Arginine at 2681 in BRCA2)- clinical significance unknown" I looked up this protein change in the [place] and it is reported by many labs to be a Variant of Uncertain Significance, not as a pathogenic mutation. The actual test report from her niece is unavailable currently but the patient reports she may be able to get a copy eventually. (see discussion below about VUS's- we do not recommend making medical management decisions for VUS's)  -1 sister who was diagnosed with lung cancerat 83 and died at 70.  She had a history of smoking.  She has 2 sons with no history of cancer.  -1 sister who was diagnosed with breast cancer in her 86's and melanoma at 21.   She is now 37.   -1 brother who is 54 with no history of cancer.  He has a son and a daughter with no history of cancer.  -1 brother who was diagnosed with lung cancer at 77 and died at 58.  He had a history of smoking and had no children.   Ms. Chauncey father was diagnosed with esophageal cancer in his 37's and died at 22.  He also had hypertension and heart disease. Ms. Carrithers has no paternal aunts or uncles.  She does not know any information about her paternal grandfather, and her paternal grandmother  had breast cancer ("claimed both breasts") diagnosed greater than age 79.    Ms. Cassis's mother died at 12 and had CAD, COPD< and angina.  She had a full hysterectomy at age 33.  Ms. Beadles has 1 maternal uncle who died in his 81's and had no history of cancer.  He had 6 children.  The patient does not have much information about these cousins' health.  Ms. Leicht maternal grandfather died in his 37's of a heart attack.  Ms. Creek maternal grandmother had breast cancer dx. Later than age 37 and died at 36.    Patient's maternal ancestors are of Northern European descent, and paternal ancestors are of Northern European descent. There is no reported Ashkenazi Jewish ancestry. There is no known consanguinity.  GENETIC COUNSELING ASSESSMENT: DALEYSSA LOISELLE is a 56 y.o. female with a family history of cancer. We, therefore, discussed and recommended the following at today's visit.   DISCUSSION: We reviewed the characteristics, features and inheritance patterns of hereditary cancer syndromes. We also discussed genetic testing, including the appropriate family members to test, the process of testing, insurance coverage and turn-around-time for results. We discussed the implications of a negative, positive and/or variant of uncertain significant result.   We recommend Ms. Molony have genetic testing for the MUTYH pathogenic mutation identified in her daughter. There is some question if her paternal  grandmother had bilateral breast cancer or not, but if it was 2 primary breast cancers, she may also be recommended to have breast cancer genetic testing.   We discussed that only 5-10% of cancers are associated with a Hereditary cancer predisposition syndrome.  One of the most common hereditary cancer syndromes that increases breast cancer risk is called Hereditary Breast and Ovarian Cancer (HBOC) syndrome.  This syndrome is caused by mutations in the BRCA1 and BRCA2 genes.  This syndrome increases an individual's lifetime risk to develop breast, ovarian, pancreatic, and other types of cancer.  There are also many other cancer predisposition syndromes caused by mutations in several other genes.  We discussed that most lung cancer is not due to a hereditary cause, but that background genetic factors can play a role in lung cancer development ( multifactorial).  Due to the fact that her brother and sister with lung cancer had a history of smoking we are less suspicious of a strong hereditary risk gene mutation causing these cancers.  We did discuss at individuals with a germline mutation c.2369C>T (T790M) in the EGFR gene are known to be at increased risk to develop lung cancer (even without a history of smoking).  We discussed that there are no clear clinical guidelines on how to manage patients who do have this mutation.  Routine CT scans for early detection have been suggested, but are still being studied to determine the appropriate management for this risk.    We also discussed the presence of her daughter's heterozygous MUTYH mutation c.1187G>A [id number].  We reviewed recessive inheritance and discussed when an individual has two MUTYH gene mutations, this is associated with an increased risk for adenomatous (precancerous) colon polyps.   Individuals with just 1 MUTYH mutation (who are carriers for MUTYH [place]) have not been proven to have a significantly increase risk for colon cancer-  it is believed to be marginally increased if at all.    The [employer]  (NCCN: V.1.2018) provides national guidelines to manage the colon cancer risk found with single (heterozygous) MUTYH mutation.  These guidelines include having a MUTYH  pathogenic mutation and:  Personal history of colon cancer  Follow instructions provided by your physician based on your personal history.  Do not have a personal history of colon cancer but have a parent/sibling/child with colon cancer: Colonoscopy every 5 years starting at age 42 or 42 years younger than the earliest age of onset, whichever is younger.  Do not have a personal history of colon cancer and do not have a parent/sibling/child with colon cancer: Data is uncertain whether specialized screening is needed.  We discussed that without a family history of colon cancer, it is unlikely that identifying a single MUTYH mutation would change their current medical management.  However, if they were found to have 2 MUTYH mutations (although unlikely) this would change their colon cancer screening management).  We discussed that about 1-2% of caucaisan individuals have a single MUTYH mutation so it is possible that she could have 2 mutations inherited from both of her parents (even though only 1 MUTYH mutation was identified in her daughter).  It is also possible that Ms. Lensing's daughter could have inherited the MUTYH mutation from her father as well.      We discussed that if she decided to pursue genetic testing and is found to have a mutation in a cancer predisposition genes, it may impact future medical management recommendations such as increased cancer screenings and consideration of risk reducing surgeries.  A positive result could also have implications for the patient's family members.   A Negative result would mean we were unable to identify a hereditary component to her cancer, but does not rule out the possibility of a  hereditary basis for her family's cancer.  There could be mutations that are undetectable by current technology, or in genes not yet tested or identified to increase cancer risk.    We discussed the potential to find a Variant of Uncertain Significance or VUS.  These are variants that have not yet been identified as pathogenic or benign, and it is unknown if this variant is associated with increased cancer risk or if this is a normal finding.  Most VUS's are reclassified to benign or likely benign.   It should not be used to make medical management decisions. With time, we suspect the lab will determine the significance of any VUS's identified if any.  Based on Ms. Hamstra's family history of cancer, meets criteria for genetic testing given the known MUTYH mutation I the family.  We discussed that if her out of pocket cost for testing is over $100, the laboratory will call and confirm whether she wants to proceed with testing.  If the out of pocket cost of testing is less than $100 she will be billed by the genetic testing laboratory.    Based on the patient's personal and family history, the statistical model Tobey Bride Cusik   Was used to estimate her risk of developing breast cancer. This estimates her lifetime risk of developing breast cancer to be approximately 13.3% This estimation does not take into account any genetic testing results.  The patient's lifetime breast cancer risk is a preliminary estimate based on available information using one of several models endorsed by the [employer] (ACS). The ACS recommends consideration of breast MRI screening as an adjunct to mammography for patients at high risk (defined as 20% or greater lifetime risk). A more detailed breast cancer risk assessment can be considered, if clinically indicated.     PLAN:  Despite our recommendation, Ms. Gailey did not wish  to pursue genetic testing at today's visit. She has decided to think about testing and will  contact me if she is interested in pursuing testing in the future.  We understand this decision, and remain available to coordinate genetic testing at any time in the future. We; therefore, recommend Ms. Vogan continue to follow the cancer screening guidelines given by her primary healthcare provider.  Based on Ms. Grunewald's family history, her sister with breast cancer might meet NCCN criteria for genetic testing (if the paternal grandmother's breast cancer was 2 individual primaries).   Lastly, we encouraged Ms. Marian to remain in contact with cancer genetics annually so that we can continuously update the family history and inform her of any changes in cancer genetics and testing that may be of benefit for this family.   Ms.  Valtierra questions were answered to her satisfaction today. Our contact information was provided should additional questions or concerns arise. Thank you for the referral and allowing [place] to share in the care of your patient.   Tana Felts, MS Genetic Counselor [email or web address] phone: [phone]  The patient was seen for a total of 40 minutes in face-to-face genetic counseling.  The patient was accompanied today by her husband.

## 2017-09-13 ENCOUNTER — Other Ambulatory Visit: Payer: Self-pay | Admitting: Family Medicine

## 2017-09-13 DIAGNOSIS — Z1231 Encounter for screening mammogram for malignant neoplasm of breast: Secondary | ICD-10-CM

## 2017-10-11 ENCOUNTER — Ambulatory Visit
Admission: RE | Admit: 2017-10-11 | Discharge: 2017-10-11 | Disposition: A | Discharge status: Home / Self Care | Payer: BC Managed Care – PPO | Source: Ambulatory Visit | Attending: Family Medicine | Admitting: Family Medicine

## 2017-10-11 DIAGNOSIS — Z1231 Encounter for screening mammogram for malignant neoplasm of breast: Secondary | ICD-10-CM

## 2019-01-23 ENCOUNTER — Other Ambulatory Visit: Payer: Self-pay | Admitting: Family Medicine

## 2019-01-23 DIAGNOSIS — Z1231 Encounter for screening mammogram for malignant neoplasm of breast: Secondary | ICD-10-CM

## 2019-03-20 ENCOUNTER — Ambulatory Visit: Payer: BC Managed Care – PPO

## 2019-04-05 ENCOUNTER — Other Ambulatory Visit: Payer: Self-pay

## 2019-04-05 ENCOUNTER — Ambulatory Visit
Admission: RE | Admit: 2019-04-05 | Discharge: 2019-04-05 | Disposition: A | Discharge status: Home / Self Care | Payer: BC Managed Care – PPO | Source: Ambulatory Visit | Attending: Family Medicine | Admitting: Family Medicine

## 2019-04-05 DIAGNOSIS — Z1231 Encounter for screening mammogram for malignant neoplasm of breast: Secondary | ICD-10-CM

## 2019-07-26 ENCOUNTER — Other Ambulatory Visit: Payer: Self-pay | Admitting: Obstetrics and Gynecology

## 2019-07-26 DIAGNOSIS — E2839 Other primary ovarian failure: Secondary | ICD-10-CM

## 2019-10-18 ENCOUNTER — Other Ambulatory Visit: Payer: Self-pay

## 2019-10-18 ENCOUNTER — Ambulatory Visit
Admission: RE | Admit: 2019-10-18 | Discharge: 2019-10-18 | Disposition: A | Discharge status: Home / Self Care | Payer: BC Managed Care – PPO | Source: Ambulatory Visit | Attending: Obstetrics and Gynecology | Admitting: Obstetrics and Gynecology

## 2019-10-18 DIAGNOSIS — E2839 Other primary ovarian failure: Secondary | ICD-10-CM

## 2020-03-01 ENCOUNTER — Other Ambulatory Visit: Payer: Self-pay | Admitting: Family Medicine

## 2020-03-01 DIAGNOSIS — Z1231 Encounter for screening mammogram for malignant neoplasm of breast: Secondary | ICD-10-CM

## 2020-04-08 ENCOUNTER — Ambulatory Visit
Admission: RE | Admit: 2020-04-08 | Discharge: 2020-04-08 | Disposition: A | Discharge status: Home / Self Care | Payer: BC Managed Care – PPO | Source: Ambulatory Visit | Attending: Family Medicine | Admitting: Family Medicine

## 2020-04-08 ENCOUNTER — Other Ambulatory Visit: Payer: Self-pay

## 2020-04-08 DIAGNOSIS — Z1231 Encounter for screening mammogram for malignant neoplasm of breast: Secondary | ICD-10-CM

## 2020-10-13 IMAGING — MG DIGITAL SCREENING BILAT W/ TOMO W/ CAD
8 series · 8 of 24 positions shown · non-contrast
Comparison: Previous exam(s).

CLINICAL DATA: Screening.

EXAM:
DIGITAL SCREENING BILATERAL MAMMOGRAM WITH TOMO AND CAD

[L CC synth-2D]
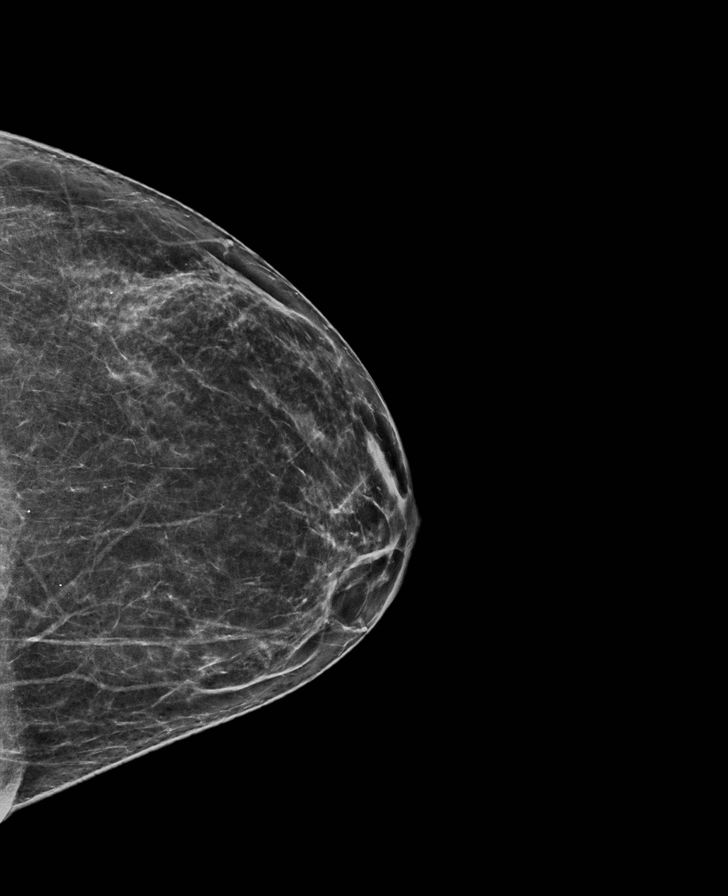

[R CC synth-2D]
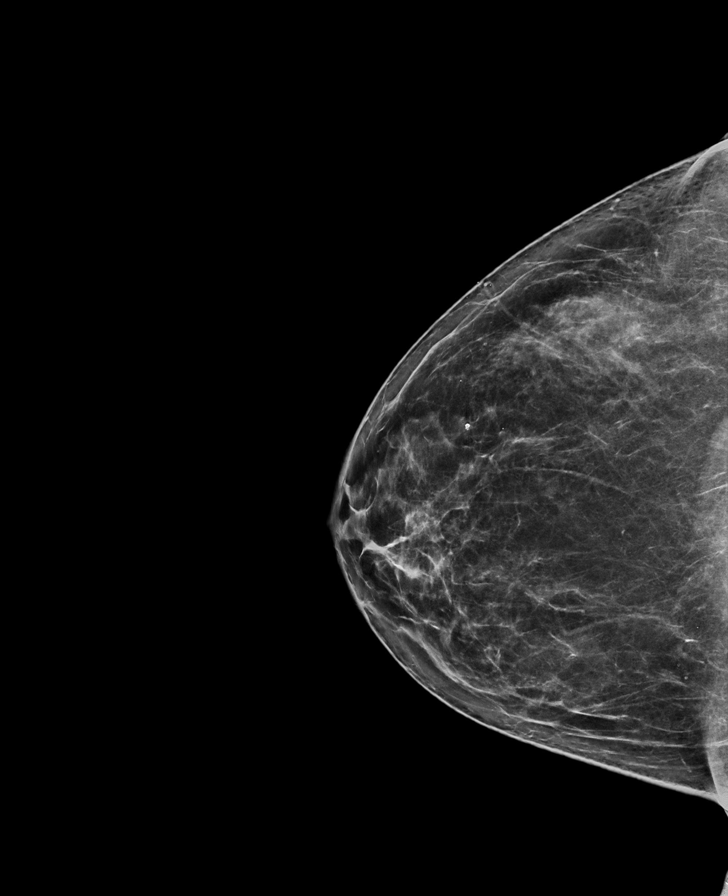

[R MLO synth-2D]
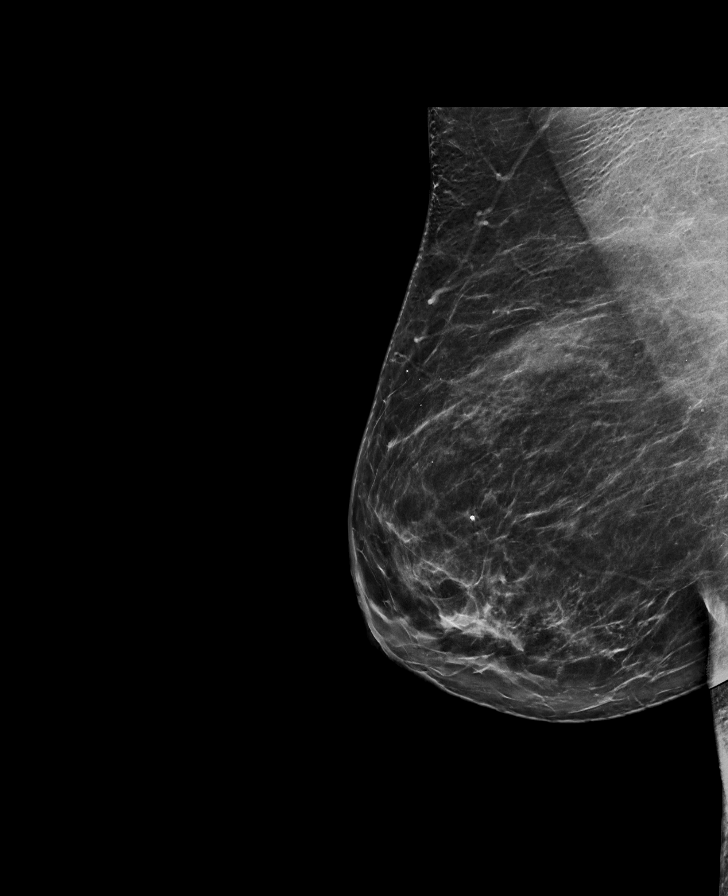

[L MLO synth-2D]
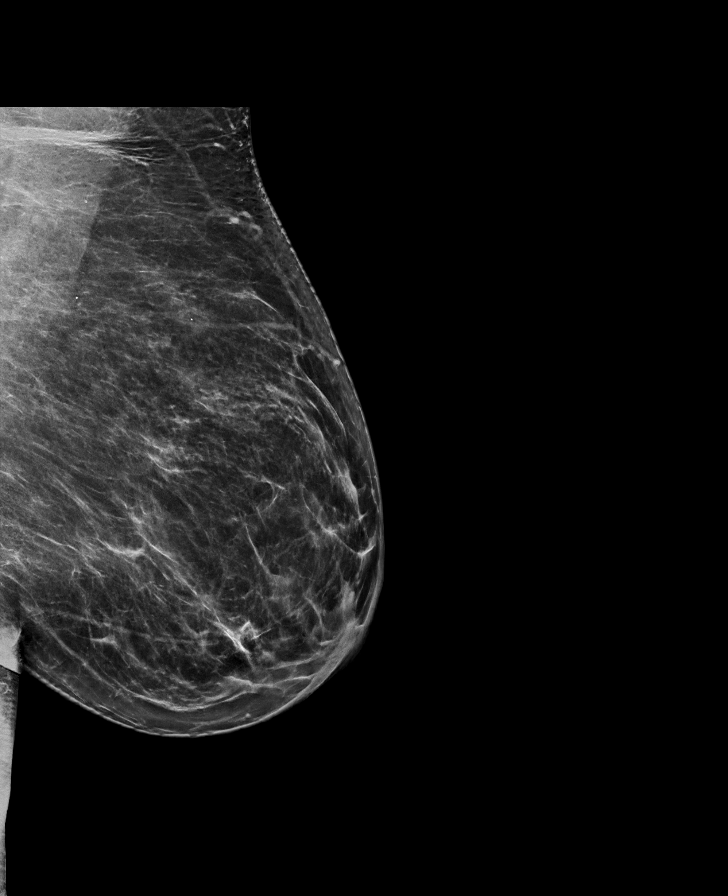

[R CC tomo · tomo slice 37/72.0]
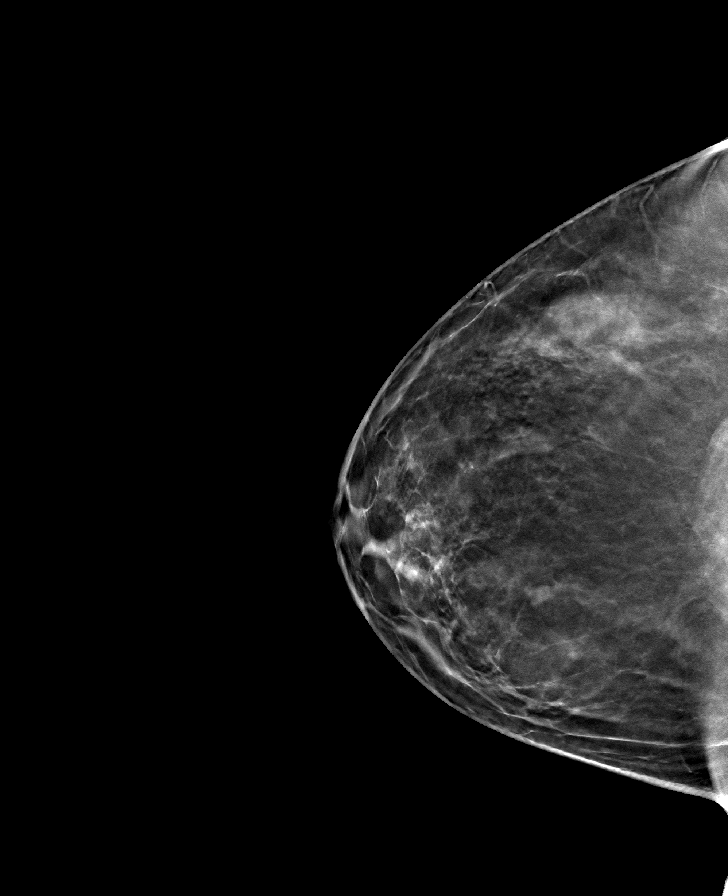

[R MLO tomo · tomo slice 39/78.0]
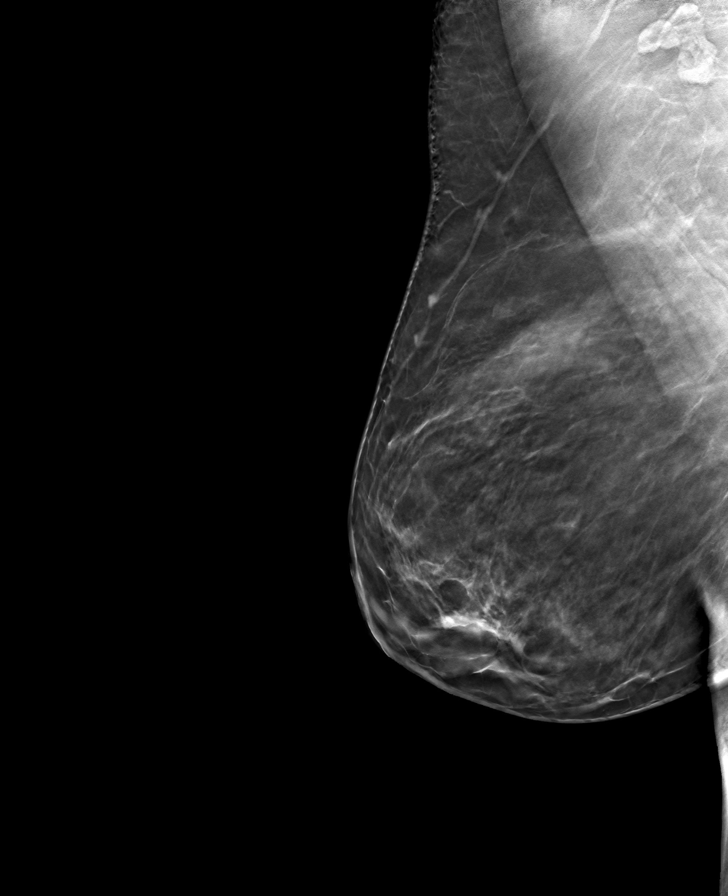

[L MLO tomo · tomo slice 37/74.0]
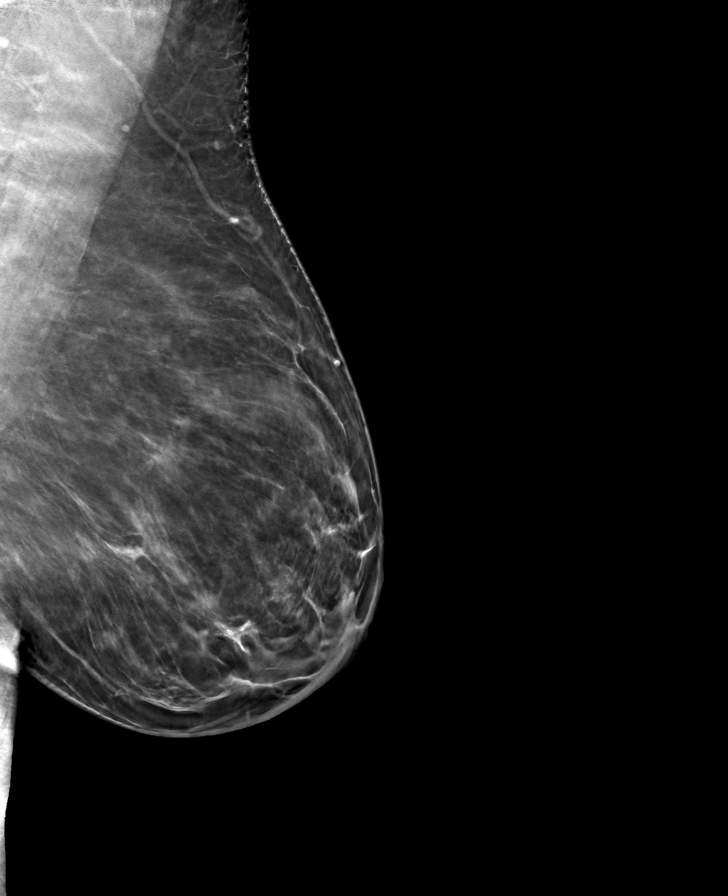

[L CC tomo · tomo slice 33/64.0]
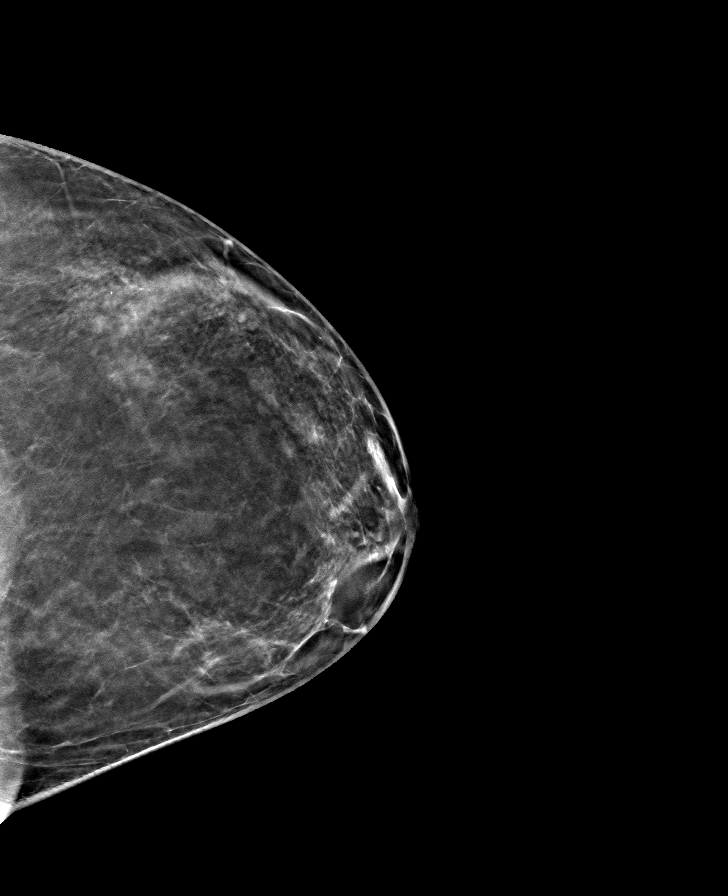

[8 of 24 positions shown; findings below may reference images not displayed]

ACR Breast Density Category b: There are scattered areas of
fibroglandular density.
FINDINGS: There are no findings suspicious for malignancy. Images were
processed with CAD.
IMPRESSION: No mammographic evidence of malignancy. A result letter of this
screening mammogram will be mailed directly to the patient.

RECOMMENDATION:
Screening mammogram in one year. (Code:[id number])

BI-RADS CATEGORY  1: Negative.

## 2022-02-10 NOTE — Progress Notes (Signed)
Formatting of this note is different from the original.  [place]  Christina Bush, [place]    ____________________________________    Christina Bush  61yr  Female  02/11/2022    Christina Bootonis a 61y.o., Female who presents with the below chief complaint:    Chief Complaint   Patient presents with    Pre-Op Exam     Right knee [place] March 06, 20[place]    During the visit we documented the patient's vitals and reviewed the following patient information: medical history, allergies, current medications, and problem list.    HPI / Interval History:  Pre-Op Exam (Right knee TKA scheduled for March 06, 2022 with Dr [place]    HPI  This is a 61year old female who presents today for pre-op clearance for knee surgery on 03/06/22.  She is doing well today and has no complaints besides knee pain and she is ready to have her surgery done.  Patient reports that she was already made aware to stop her aspirin 10 days prior to surgery.      Current Outpatient Medications:     aspirin 81 mg Oral Tablet, Delayed Release (E.C.), Take 1 Tablet by mouth daily., Disp: , Rfl:     CALCIUM 26-VIT D3-MAGNESIUM 15 ORAL, Take  by mouth., Disp: , Rfl:     fexofenadine (ALLEGRA) 180 mg Oral Tablet, Take 1 Tablet by mouth., Disp: , Rfl:     lansoprazole (PREVACID) 30 mg Oral, Take 1 Capsule by mouth daily., Disp: , Rfl:     QUEtiapine fumarate ER (SEROquel XR) 50 mg Oral Tablet Sustained Release 24HR, SMARTSIG:2 Tablet(s) By Mouth Every Evening, Disp: , Rfl:     venlafaxine (EFFEXOR) 75 mg Oral Tablet, Take 1 Tablet by mouth daily., Disp: , Rfl:     venlafaxine XR (EFFEXOR-XR) 150 mg Oral Capsule, Sust. Release 24HR, Take 1 Capsule by mouth daily., Disp: , Rfl:     estradioL (ESTRACE) 0.01 % (0.1 mg/gram) Vaginal Cream, [id number] vaginally., Disp: , Rfl:     Reviewed medication administration. Discussed purpose and benefit of treatment,  and health risks of noncompliance. Patient expressed understanding.     Review of Systems   Constitutional:  Negative for malaise/fatigue and weight loss.   Respiratory:  Negative for shortness of breath and wheezing.    Cardiovascular:  Negative for chest pain and palpitations.   Neurological:  Negative for dizziness and headaches.   Psychiatric/Behavioral:  Negative for depression. The patient is not nervous/anxious.      Visit Vitals:  BP 109/61 (BP Site: Left arm, Patient Position: Sitting, Cuff Size: Adult)   Pulse 85   Resp 18   Ht 5' 8"$  (1.727 m)   Wt 210 lb (95.3 kg)   SpO2 98%   BMI 31.93 kg/m     Physical Exam  Constitutional:       General: She is not in acute distress.     Appearance: Normal appearance. She is not ill-appearing.   HENT:      Head: Normocephalic and atraumatic.      Right Ear: Tympanic membrane, ear canal and external ear normal.      Left Ear: Tympanic membrane, ear canal and external ear normal.      Nose: Nose normal.      Mouth/Throat:      Mouth: Mucous membranes are moist.  Pharynx: Oropharynx is clear.   Eyes:      Extraocular Movements: Extraocular movements intact.      Conjunctiva/sclera: Conjunctivae normal.      Pupils: Pupils are equal, round, and reactive to light.   Neck:      Vascular: No carotid bruit.   Cardiovascular:      Rate and Rhythm: Normal rate and regular rhythm.      Heart sounds: Normal heart sounds. No murmur heard.  Pulmonary:      Effort: Pulmonary effort is normal.      Breath sounds: Normal breath sounds.   Abdominal:      General: Abdomen is flat. Bowel sounds are normal.      Palpations: Abdomen is soft.   Musculoskeletal:         General: Normal range of motion.      Cervical back: Normal range of motion and neck supple.      Right lower leg: No edema.      Left lower leg: No edema.   Skin:     General: Skin is warm and dry.      Findings: No lesion or rash.   Neurological:      General: No focal deficit present.      Mental Status: She is  alert and oriented to person, place, and time.   Psychiatric:         Mood and Affect: Mood normal.         Behavior: Behavior normal.     Results for orders placed or performed in visit on 01/28/22 (from the past 720 hour(s))   VITAMIN D 1,25-DIHYDROXY ([facility])    Collection Time: 01/28/22  7:55 AM   Test Value Low-High    Vitamin D, 1- 25-diOH 31.9 24.8 - 81.5 pg/mL    Narrative    Performed at:  [facility]  [address] [place], [place]  [id number]  Lab Director: Rush Farmer MD, Phone:  [phone]   LIPID PANEL    Collection Time: 01/28/22  7:55 AM   Test Value Low-High    Cholesterol, Total 192 100 - 199 mg/dL    Triglycerides 134 0 - 149 mg/dL    Cholesterol, HDL 54 >39 mg/dL    Cholesterol, VLDL (calculated) 24 5 - 40 mg/dL    LDL Chol Calc (NIH) 114 (H) 0 - 99 mg/dL    Narrative    Performed at:  [facility]  [address] [place], [place]  [id number]  Lab Director: Rush Farmer MD, Phone:  [phone]   COMPREHENSIVE METABOLIC PANEL    Collection Time: 01/28/22  7:55 AM   Test Value Low-High    Glucose 98 70 - 99 mg/dL    BUN 14 8 - 27 mg/dL    Creatinine 0.65 0.57 - 1.00 mg/dL    eGFR 99 >59 mL/min/1.73    BUN/Creatinine 22 12 - 28    Sodium 145 (H) 134 - 144 mmol/L    Potassium 3.9 3.5 - 5.2 mmol/L    Chloride 106 96 - 106 mmol/L    Bicarbonate (TCO2) 24 20 - 29 mmol/L    Calcium, total 9.1 8.7 - 10.3 mg/dL    Protein, total 6.2 6.0 - 8.5 g/dL    Albumin 4.5 3.8 - 4.8 g/dL    Globulin (calc.) 1.7 1.5 - 4.5 g/dL    Albumin / Globulin 2.6 (H) 1.2 - 2.2    Bilirubin, Total 0.2 0.0 - 1.2 mg/dL  Alkaline Phosphatase 68 44 - 121 IU/L    AST (SGOT) 16 0 - 40 IU/L    ALT (SGPT) 18 0 - 32 IU/L    Narrative    Performed at:  [address]  [address] [place], [place]  [id number]  Lab Director: Rush Farmer MD, Phone:  [phone]   (PANEL)-CBC WITH DIFFERENTIAL    Collection Time: 01/28/22  7:55 AM   Test Value Low-High    WBC 4.4 3.4 - 10.8 x10E3/uL     RBC 4.19 3.77 - 5.28 x10E6/uL    Hemoglobin 13.6 11.1 - 15.9 g/dL    Hematocrit (calc.) 40.2 34.0 - 46.6 %    MCV 96 79 - 97 fL    MCH 32.5 26.6 - 33.0 pg    MCHC (calc.) 33.8 31.5 - 35.7 g/dL    RDW 12.7 11.7 - 15.4 %    Platelet 198 150 - 450 x10E3/uL    Neutrophil (%) 64 Not Estab. %    Lymphocyte (%) 25 Not Estab. %    Monocyte (%) 7 Not Estab. %    Eosinophil (%) 3 Not Estab. %    Basophil (%) 1 Not Estab. %    Neutrophil (#) 2.8 1.4 - 7.0 x10E3/uL    Lymphocyte (#) 1.1 0.7 - 3.1 x10E3/uL    Monocyte (#) 0.3 0.1 - 0.9 x10E3/uL    Eosinophil (#) 0.1 0.0 - 0.4 x10E3/uL    Basophil (#) 0.0 0.0 - 0.2 x10E3/uL    Immature Grans (%), CBC with Diff/Plate 0 Not Estab. %    Immature Grans (#), CBC with Diff/Plate 0.0 0.0 - 0.1 [id number]    Narrative    Performed at:  [address]  [address] [place], [place]  [id number]  Lab Director: Rush Farmer MD, Phone:  [phone]     Impression / Plan / Follow-up:  Diagnoses and all orders for this visit:    Pre-operative examination  -     AMB STANDARD EKG ([place])  -     (PANEL)-CBC WITH DIFFERENTIAL; Future  -     BASIC METABOLIC PANEL; Future  -     PT (PROTHROMBIN TIME) WITH INR; Future  -     PTT; Future  -     TYPE & SCREEN; Future  -     XRAY CHEST 2 VIEWS; Future    Primary osteoarthritis of right knee  -     AMB STANDARD EKG (CLINIC ONLY- WITH TRACEMASTER)  -     (PANEL)-CBC WITH DIFFERENTIAL; Future  -     BASIC METABOLIC PANEL; Future  -     PT (PROTHROMBIN TIME) WITH INR; Future  -     PTT; Future  -     TYPE & SCREEN; Future  -     XRAY CHEST 2 VIEWS; Future    Other orders  -     estradioL (ESTRACE) 0.01 % (0.1 mg/gram) Vaginal Cream; [id number] Applicators vaginally.  -     QUEtiapine fumarate ER (SEROquel XR) 50 mg Oral Tablet Sustained Release 24HR; SMARTSIG:2 Tablet(s) By Mouth Every Evening  -     EKGSCAN    BP 109/61 today, well controlled.  Reviewed lab results with patient.  She will need additional labs done including CBC,  BMP, PT/PTT and type & screen.  EKG sinus rhythm today.  Ordered chest XR today.  Patient is cleared for right total knee arthroplasty pending normal labs and XR.  Continue with current follow up appointment.      Return for As previously scheduled.    This chart was created using Dragon voice recognition software/speech to text recognition program.  Although reasonable attempts were made to ensure accuracy, there may be unintentional errors as a result of incorrect voice recognition/transcription which were not appreciated and corrected at the end of this visit and completion of this note.    Christina Fossa, PA-C  Electronically signed by Lennon Alstrom, PA at 02/11/2022  9:08 AM EDT

## 2022-02-24 NOTE — Progress Notes (Signed)
Formatting of this note is different from the original.     02/24/22 0906   Ortho Joint Preop Class Attendance   Education Date 02/24/22   Attendance Type Class     [facility]  -    Patient participated in [place] provided by Ortho Patient Navigator via email communication    Reviewed in pre-op education materials:    Preparing for Surgery - Education on multiple topics regarding the weeks leading up to surgery and surgery day.  Importance of your Coach  Preparing your home for your return after surgery  Medical clearance, PASS call, pre-op testing and soap  Understanding your surgery and anesthesia  General diet considerations  Complication Prevention - Potential complications and reduction methods.  Participation in therapy sessions, frequent mobilization, ankle pumps, and SCD use  Importance of DVT prevention and taking blood thinners as prescribed  Importance and use of incentive spirometer  Use of stool softeners/laxatives  Pain Management -  Pain level expectations and multimodal pain control methods.  As need oral narcotics  Scheduled medications  Ice packs and proper elevation  Therapy - Pre-op exercises as well as therapy expectations following surgery.  Activities of daily living  Anticipated DME    Patient provided with digital [facility]. Advised that pre-op soap/washcloth pack available through surgeons office or by purchase in stores. Contact information provided to patient and encouraged to reach out with any questions.    Maylon Cos BSN, RN  Ortho/Spine Patient Primary school teacher   [phone] office    Electronically signed by Jacinta Shoe, RN at 02/24/2022  9:06 AM EDT

## 2022-03-02 NOTE — Progress Notes (Signed)
Formatting of this note is different from the original.  PREOPERATIVE INSTRUCTIONS:     INSTRUCTIONS SENT TO PT VIA TM?   ?   In the [place] leading up to your surgery:   ?   ?   Ensure you know which of your prescription and over the counter medications you should be taking and which you should not. See the MEDICATION INSTRUCTIONS section below. Confirm with your surgeon's office if you are unclear.   ?   Do NOT drink alcohol or smoke tobacco products 24 hours prior to your surgery.   Interested in quitting smoking? Call 1-800-Quit Now 367-443-7391)   Do NOT use marijuana in ANY form (smoking/edible (CBD OIL)/synthetic) for a minimum of 48 HOURS prior to your surgery.   Do NOT use any recreational or street drugs for a minimum of 26 HOURS prior to surgery or your surgery may be cancelled.  ?   ?   Please inform your anesthesia provider if you use any of these substances, as this can have a significant impact on your anesthesia care and your safety.   ?   ?   The DAY BEFORE your surgery:   ?   ?   Arrange for an adult (age 63 or over) to transport you home after your surgery AND stay with you for 24 hours after surgery to ensure your safety. Patients may NOT be discharged from the hospital unaccompanied and may NOT be transported unaccompanied by a taxi, Therapist, occupational. Patients may not be transported home via public transportation (bus) under any circumstances.   ?   ?   Verify your surgery time and arrival time to the hospital with your surgeon's office.   ?   ?   If you develop any symptoms of illness prior to your surgery (cough, fever, flu-like symptoms, nausea/vomiting), notify your surgeon's office immediately.   ?   ?   UNLESS DIRECTED OTHERWISE by your anesthesiologist or surgeon, do not eat or drink ANYTHING after midnight prior to your surgery, including hard candy, cough drops or breath mints. Do NOT chew gum.   ?   The DAY OF your surgery:   ?   Take any morning medications you have been instructed to take with  a small amount of WATER only. See the MEDICATION INSTRUCTIONS section below.   Bathe or shower with soap and water. You may brush your teeth.   Roll-on or stick deodorant is acceptable. No aerosols may be used.   No make up, perfume, after shave, lotion or other skin cream may be used.   Wear comfortable, loose fitting clothing to the hospital.   Remove ALL jewelry PRIOR to coming to the hospital, including ANY piercings, even if plastic. ALL FACIAL PIERCINGS, INCLUDING LIP/TONGUE/NOSE, MUST BE REMOVED PRIOR TO SURGERY OR YOUR SURGERY MAY BE CANCELED.   Do not bring valuables, such as jewelry, cash or credit cards to the hospital.   If you are to be admitted after your surgery, bring a small bag containing essentials and toiletries. Bring a case for glasses or contact lenses. Plan to leave your bag with the adult accompanying you, who can bring the bag to your hospital room once assigned.   Bring your CPAP or BiPAP machine with you to the hospital.   ?   ?   A legal guardian must accompany minors. Proof of legal guardianship must be provided if the accompanying adult is not the parent.   ?   ?  Other important items to bring with you to the hospital:   ?   -Insurance cards/forms   -Any x-rays/MRI/CT reports or films you may have.   -Advance Directive if available and not already on file within the [facility] system   -A list of all medications you are taking along with the current prescribed dosages.   ?   MEDICATION INSTRUCTIONS   ?   Your surgeon will have instructed you to hold 7 days prior   to surgery date: Ibuprofen (Motrin or Advil), Naprosyn (Aleve), Aspirin (UNLESS YOU HAVE A CARDIAC STENT), Vitamin E, Fish oil, Krill Oil, and Multivitamins.   Please contact your doctor about whether to stop Aspirin (continue only if you have a cardiac stent in your heart) , coumadin, heparin, and other blood thinners.   ?   STOP ALL HERBAL SUPPLEMENTS.   ?   Do NOT take a morning dose of insulin. Please contact the doctor  who prescribes your insulin for recommendations about the evening dose the night before your surgery.   ?   HOLD DAY OF SURGERY ONLY: ALL Diabetic medications.   ?   TAKE only blood pressure, heart, thyroid, breathing and stomach medications morning of surgery. USE INHALERS AS USUAL, and bring your inhalers with you to the hospital.   ?   Please take your pain medication (except for those listed above) the morning of your surgery if you are experiencing pain. We do not want you to have pain unnecessarily.   ?   ADDRESS AND PARKING DIRECTIONS:   ?   [facility]   ?   [address]   ?   [place] [address]   ?   Follow the signs to "[facility]", which will   ?   lead you directly to the parking garage where patients may park free of charge.   ?   Enter the hospital on the ground floor and check in with Registration, which is on the right side of the stairs in the main lobby.   ?   You will need an Insurance card and a Photo ID for registration.   ?   Once you are registered, you will be sent directly to the 2nd floor where the surgery department is located.   ?   IF, ON THE MORNING OF SURGERY, YOU ANTICIPATE A DELAYED ARRIVAL OR DECIDE NOT TO HAVE YOUR SURGERY, PLEASE NOTIFY THE PREOP DEPARTMENT AT [phone].  ?   ?   Please call, text, or email me with any questions or concerns.     Cheron Schaumann, RN  [facility]  Pre-Anesthesia Surgical Screening  [phone]  [email or web address]  ?   ?   last updated 05/30/18 by Anesthesia   Patients name and birthdate verified. Surgery and site/laterality confirmed. Information obtained from telephone interview with patient. All questions and concerns by patient addressed. Pre operative instructions reviewed with patient, patient voiced their understanding.   _______________   Electronically signed by Cheron Schaumann, RN at 03/02/2022 12:49 PM EDT

## 2022-03-06 NOTE — Progress Notes (Signed)
Formatting of this note is different from the original.  Physical Therapy Evaluation Note    General Info:  Room #:  ORP/ORP-49  Visit Time In: 1539  Visit Time Out: 1617    Barriers to Discharge: Medical needs    Problem List: Pain, Orthopedic restrictions, Impaired mobility, Decreased range of motion, Decreased strength    Brief Objective:  *Please refer to the Acute PT Documentation Summary Report or to flowsheets for full details*    Assessment:  Pt is a 61 yo female S/P R TKA by Dr. Felton Clinton DOS 03/06/22. PLOF: lives with spouse in a [facility], 1 STE, fully IND at baseline.     Pt presents in bed, in NAD, reports pain improved to R knee since administered pain medication, and agreeable for PT evaluation/mobility. Pt demonstrated bed mobility with SUP, able to self-assist R LE on/off bed. Pt performed transfers with FWW and CGA-SBA. Pt participated in gait training, x150', with FWW and CGA progressed to SBA. Pt with a steady, step to gait pattern, no complaints of increased pain. Pt participated in step training, x1 stair, with CGA-SBA and cues for correct LE sequencing. Demo'd how to perform with walker at home. Educated pt on safety with stairs/mobility, car transfer technique, and HEP. Pt verbalized good understanding. By end of tx, pt positioned for comfort back in bed and handoff to RN at bedside.     Pt would benefit from PT services to address the above listed deficits to improve her overall functional mobility. Consider home with caregiver support, OP PT, and FWW.    Recommended Activity:  Ambulate with clinical staff, Ambulate with family/caregiver, 1 person assist, Mobility device (comment)    Therapy Considerations on Discharge:  Therapy on Discharge: Outpatient therapy  Home Equipment Recommended: Rolling walker  Other Recommended Services: Ambulation team    Plan:  PT Therapy Plan: Continue physical therapy  PT Frequency: 2 visits  PT Interventions: Assistive device recommendations, Neuromuscular reeducation,  Functional activities, Gait training, Modalities as needed, Patient and family education, Stair training, Transfer training, Therapeutic exercise    Name: Christina Bush MRN: [id number]   Primary Dx: Aftercare following surgery [Z48.89] Admit Date: 03/06/2022   Treatment Dx     ICD-9-CM ICD-10-CM   1. Primary osteoarthritis of right knee  715.16 M17.11   2. Encounter for pre-operative laboratory testing  V72.84 Z01.812   3. Status post total knee replacement, right  V43.65 Z96.651   4. Difficulty walking  719.7 R26.2       Pecolia Ades PT, DPT  Rehab Department [phone]    Electronically signed by Alvester Morin, PT at 03/06/2022  4:43 PM EDT

## 2022-03-06 NOTE — Interval H&P Note (Signed)
Formatting of this note might be different from the original.  I have seen and examined the patient.  I have reviewed the H&P.  There are no interval changes.  Proceed as planned.    The patient has mobility limitation that significantly impairs their ability to participate in one or more mobility-related activities of daily living in the home.  The patient is able to safely use a walker.  The functional mobility deficit can be sufficiently resolved by use of a walker.    Electronically signed by Terie Purser, MD at 03/06/2022 10:05 AM EDT    Source Note - Provider, Unknown - 02/27/2022 11:04 AM EDT

## 2022-03-06 NOTE — Care Plan (Signed)
Formatting of this note might be different from the original.    Problem: Balance Impairment (Functional Deficit)  Goal: Prevent/Manage Altered Mobility  Description: PT Goals (to be addressed by 03/12/22):    1) Pt will demonstrate bed mobility with MOD IND utilizing handrails in prep for transition to sitting.  2) Pt will demonstrate supine to/from sit with MOD IND utilizing handrails to facilitate participation in upright position.  3) Pt will demonstrate sit to/from stand transfers with MOD IND and LRAD in prep for standing and gait activities.  4) Pt will demonstrate bed to/from chair transfers with MOD IND and LRAD in prep for OOB activities and in order to promote upright posture while OOB.  5) Pt will ambulate 150> feet with SUP and LRAD to facilitate ambulation tolerance in prep for next level of care/discharge.  6) Pt will a/descend 1 steps with FWW, SUP and LRAD, in prep for home re-entry.    Pecolia Ades PT, DPT    Outcome: Ongoing, Progressing    Electronically signed by Alvester Morin, PT at 03/06/2022  4:42 PM EDT

## 2022-03-06 NOTE — Procedures (Signed)
Formatting of this note is different from the original.  Operative Note    Patient: Christina Bush MRN: [id number]  [facility]  Surgery Date:  03/06/2022  Case Tracking Events       Event Time In    Procedure Start 03/06/2022 1216    Procedure End 03/06/2022 1323            Procedure  Primary Surgeon    ARTHROPLASTY KNEE REPLACEMENT (Right)  Terie Purser, MD    * Panel 2 does not exist *  * Panel 2 does not exist *    * Panel 3 does not exist *  * Panel 3 does not exist *     Surgeon(s) and Role:     Terie Purser, MD - Primary    Other OR Staff/Assistants:  Surgical Assistant: Benson Norway    1st Assistants Tasks:  Closing and Retraction    Pre-operative Diagnosis:  primary osteoathritis of right knee    Post-operative Diagnosis: same as preop diagnosis    Anesthesia Type: Spinal Anesthesia     Complications: No    EBL: 150    Specimens: No specimens    Implant Name Type Inv. Item Serial No. Manufacturer Lot No. LRB No. Used Action   CEMENT BONE HIGH VISCOSITY PALACOS R REPLACES SCM [id number] - [id number] Implantables CEMENT BONE HIGH VISCOSITY PALACOS R REPLACES SCM [id number]  [employer] [id number] Right 2 Implanted   medacta resurfacting patella     [id number] Right 1 Implanted   medacta femoral component     [id number] Right 1 Implanted   medacta tibial tray fixed     [id number] Right 1 Implanted   medacta tibial insert fixed     [id number] Right 1 Implanted       Description of procedure:   INDICATIONS FOR THE PROCEDURE:  This patient has progressive right knee pain due to severe  bone-on-bone osteoarthritis. The pt has failed conservative treatments including  anti-inflammatories, Tylenol, gait assistance, activity modification, physical  therapy, and injections. The pt wished to proceed with a right total knee  arthroplasty. I discussed the risks in detail preoperatively including  bleeding, infection, blood clots, damage to nerves, vessels, tendons, ligaments,  loosening,  fracture, polyethylene wear, stiffness, infection from blood  transfusion, pneumonia, heart attack, stroke, liver, or kidney failure or even  death due to anesthesia. The pt wished to proceed.    DESCRIPTION OF PROCEDURE:  The patient was identified in the preoperative holding area by the name  bracelet. Consent was obtained for a right total knee arthroplasty. The right  knee was site marked. The pt was then brought back to the operating room where they  underwent spinal anesthesia. The patient was positioned on a regular operating table in  supine position. All bony prominences were well padded. A non sterile tourniquet was applied to the thigh.  Preoperative antibiotics as well as tranexamic acid were given. The right leg was prepped and  draped in sterile fashion from the inferior aspect of the ribcage through the  toes. Timeout was  performed indicating that the correct consent had been obtained, the correct  site had been marked that the patient had received preoperative antibiotics and that all  implants and instruments were available in the operating room. The right leg was  exsanguinated using an ace wrap. The tourniquet was brought up to 300 mmHg. I started with a standard midline incision. I used a clean  blade to dissect full thickness medial and lateral soft tissue flaps. I made a  medial parapatellar arthrotomy.   released soft tissue sleeve off the proximal medial tibia and I removed a small  portion of the posterior fat pad. I cleared out the gutters and I flexed the  knee up. I removed the remaining medial meniscus, cut the ACL and removed the lateral meniscus.  I then removed some osteophytes from the distal femur using an  osteotome and a rongeur.  I then used the entry reamer  to gain access to the femoral canal. The canal was suctioned. The distal femoral cutting guide was placed with the worn and unworn paddles on their appropriate condyle.  The distal femoral resection was made and the cuts  were measured and found to match the thickness of the implant both medially and laterally.  The femoral sizing guide was then applied to the distal femur and pinned with 0 degrees of rotation.  It was sized and the appropriate block was selected.  The cuts were made taking care to protect the MCL.  The posterior condylar cuts were measured and found to match the thickness of the implant. Next the tibia was exposed and cut in an anatomic position taking care to match slope, varus/valgus, and rotation.  The resection depth was set at 98m of the preserved areas.  The cut was made and verified with a caliper.  Next the appropriate spacer block was found to be stable in flexion and extension.  Trial components were placed. It has an excellent stability with full extension and about 120 degrees of flexion. I then  brought the knee into extension, I everted the patella. I made a freehand cut. I  removed the remaining osteophytes using a rongeur as well as some fat superior  to the patella to prevent any crepitus later. The patella was sized and the lug holes were drilled and a trial patellar button was  placed and had excellent patellar tracking through a range of motion. I then  brought the knee into extension and I marked my external rotation of the tibia  with the components in place. I flexed the knee up and removed the trial  components. I re-exposed the tibia and placed the appropriately sized tibial tray in according to the external rotation markings. It was pinned, reamed, and  Broached in the standard fashion.  I  irrigated the bony ends with pulse lavage and dried with lap sponges and  suction. I then injected the posterior capsule as well as  areas of the genicular nerves using a pain cocktail. Two batches of cement containing gentamicin were  mixed on the back table. The final implants that are listed above were opened I  then manually pressurized cement into the proximal tibia. The final tibial  component was  impacted into place. Excess cement was removed using a freer.  I then manually pressurized cement into the distal femur. The final femoral  component was impacted into place. The excess cement was removed using a  freer. The final polyethylene was placed. The knee was brought into  extension. I everted the patella. This was irrigated using pulse lavage and  then dried with lap sponges and suction. Cement was pressurized into the back  side of the patella and the final patellar button was clamped into place.  Excess cement was removed using a freer. At this point, the tourniquet was let  down. The knee was held in extension with axial  pressure until the cement hardened. Once the cement hardened, I checked the  stability and the patient had 120 degrees of flexion with excellent patellar tracking  and excellent stability on varus and valgus stress.  Bleeding was controlled using Bovie  cautery. I removed some excess hardened cement from the edges of the implant  using a curved osteotome.   The knee was irrigated a final  time using pulse lavage. I closed  the arthrotomy with a running #1 stratafix. A fatty layer was closed  with interrupted 0 Vicryl, subcutaneous tissue was closed with interrupted 2-0  Vicryl. The skin was closed with a running subcuticular 3-0 Monocryl.  Steri-Strips were placed over the incision and a sterile dressing was applied  prior to the drapes being removed. All sponge, needles, and instrument counts  were correct at the conclusion of the procedure. The patient was awoken from  anesthesia and transferred to the recovery room in stable condition.        Terie Purser, MD           Terie Purser, MD  Electronically signed by Terie Purser, MD at 03/06/2022  3:47 PM EDT

## 2022-03-06 NOTE — Progress Notes (Signed)
Formatting of this note might be different from the original.  Christina Bush  10-12-1960     PT and/or OT Ordered: Yes    Patient Scheduled for Discharge: Yes    Spinal Worn Off & Patient Functioning at 5/5 Strength: Yes    Patient Medicated for Nausea: No    Last Dose Administered At: n/a   Patient Complaining of N/V within last 25mns: No    Patient Medicated for Pain: Yes    Last Dose Administered At: 1533  Patient Complaining of Pain within last 127ms: No    Patient Received Appropriate PIV Fluids and/or Bolus Yes    Patient Complaining of Dizziness within last 1079m: No  Patient Sitting Up with HOB Raised: Yes     Patient Tolerating PO Meds/Fluids/Food: Yes    X-ray Completed: Yes    Resulted: Yes  MD Notified of Results: Yes    PT/OT Notified of Patient's Status: Yes    Call Placed At: [id number]   Electronically signed by DzwZacarias PontesN at 03/06/2022  3:38 PM EDT

## 2022-03-06 NOTE — Progress Notes (Signed)
Formatting of this note might be different from the original.   Right knee X-ray completed.     PPE worn; N-95 Mask,goggles,gloves.    Electronically signed by Chaya Jan R at 03/06/2022  1:50 PM EDT

## 2022-03-06 NOTE — Progress Notes (Signed)
Formatting of this note might be different from the original.  Pt does not have a walker at home. Paper work signed and walker placed in PACU for pt once discharged  Electronically signed by Maia Petties, RN at 03/06/2022 10:48 AM EDT

## 2022-09-29 NOTE — Procedures (Signed)
Formatting of this note might be different from the original.  Images from the original note were not included.      INTERVENTIONAL RADIOLOGY PROCEDURE NOTE    Procedure Performed: Lumbar Myelogram    Procedure performed by: Job Founds, PA-C     Attending Physician: Nicole Kindred, MD    Supervision:  General    Pre-operative Diagnosis: Radiculopathy arm, lumbar    Post-operative Diagnosis: Same    Anesthesia: Local Lidocaine    Findings: Fluoroscopic guided myelogram, see dictated report for details.    Estimated Blood Loss: None    Complications: None    Resumption of anticoagulation recommendations: May resume DVT prophylaxis; all others resume tomorrow.     Disposition: Bedrest for 4 hours before discharge to home, recommend  continuing bedrest with limited activity next 24 hours.    Job Founds, PA-C  [place]  [facility]  09/29/2022      Pager: [phone]    Electronically signed by Minerva Fester, MD at 09/29/2022  4:23 PM EST

## 2022-10-27 ENCOUNTER — Encounter

## 2022-10-27 ENCOUNTER — Inpatient Hospital Stay: Admit: 2022-10-27 | Payer: BLUE CROSS/BLUE SHIELD | Primary: Diagnostic Radiology

## 2022-10-27 ENCOUNTER — Inpatient Hospital Stay: Admit: 2022-10-27 | Payer: BLUE CROSS/BLUE SHIELD | Attending: Neurological Surgery | Primary: Diagnostic Radiology

## 2022-10-27 DIAGNOSIS — Z01810 Encounter for preprocedural cardiovascular examination: Secondary | ICD-10-CM

## 2022-10-27 DIAGNOSIS — Z01818 Encounter for other preprocedural examination: Secondary | ICD-10-CM

## 2022-10-27 LAB — COMPREHENSIVE METABOLIC PANEL
ALT: 28 U/L (ref 10–49)
AST: 22 U/L (ref 0.0–33.9)
Albumin: 4 gm/dl (ref 3.4–5.0)
Alkaline Phosphatase: 80 U/L (ref 46–116)
Anion Gap: 6 mmol/L (ref 5–15)
BUN: 14 mg/dl (ref 9–23)
CO2: 31 mEq/L (ref 20–31)
Calcium: 9.6 mg/dl (ref 8.7–10.4)
Chloride: 107 mEq/L (ref 98–107)
Creatinine: 0.76 mg/dl (ref 0.55–1.02)
GFR African American: >60
GFR Non-African American: >60
Glucose: 97 mg/dl (ref 74–106)
Potassium: 4.7 mEq/L (ref 3.5–5.1)
Sodium: 144 mEq/L (ref 136–145)
Total Bilirubin: 0.2 mg/dl — ABNORMAL LOW (ref 0.30–1.20)
Total Protein: 6.6 gm/dl (ref 5.7–8.2)

## 2022-10-27 LAB — CBC
Hematocrit: 40.1 % (ref 35.0–47.0)
Hemoglobin: 13.3 gm/dl (ref 11.0–16.0)
MCH: 31.9 pg (ref 25.4–34.6)
MCHC: 33.2 gm/dl (ref 30.0–36.0)
MCV: 96.2 fL (ref 80.0–98.0)
MPV: 10.2 fL — ABNORMAL HIGH (ref 6.0–10.0)
Platelets: 209 10*3/uL (ref 140–450)
RBC: 4.17 M/uL (ref 3.60–5.20)
RDW: 48.8 — ABNORMAL HIGH (ref 36.4–46.3)
WBC: 4.8 10*3/uL (ref 4.0–11.0)

## 2022-10-27 LAB — EKG 12-LEAD
Atrial Rate: 81 {beats}/min
Calculated P Axis: 59 degrees
Calculated R Axis: 25 degrees
Calculated T Axis: 35 degrees
DIAGNOSIS, 93000: NORMAL
P-R Interval: 150 ms
Q-T Interval: 372 ms
QRS Duration: 80 ms
QTC Calculation (Bezet): 432 ms
Ventricular Rate: 81 {beats}/min

## 2022-10-27 LAB — APTT: aPTT: 32.9 seconds (ref 25.1–36.5)

## 2022-10-27 LAB — PROTIME-INR
INR: 0.9 (ref 0.1–1.1)
Protime: 10.9 seconds (ref 10.2–12.9)

## 2022-11-03 NOTE — Other (Signed)
PREOPERATIVE INSTRUCTIONS  Please Read Carefully    [x]$  Your procedure is scheduled on: 11/06/2022    [x]$  The day before your surgery, call the surgeon's office to check on the surgery time and the time you should arrive.     [x]$  On the day of your surgery, arrive at the time given to you by your surgeon and check in at the first floor registration desk.    [x]$  DO NOT eat anything after midnight before your surgery. This includes gum, mints or hard candy.  May have Water, black coffee (NO CREAM) or regular Gatorade (not sugar free) up to 3 hours prior to surgery, but no more than 6 ounces per hour (approx.  cup).    [x]$  You may brush your teeth the morning of surgery, however DO NOT swallow any water.    []$  No smoking after midnight.  Smoking should be reduced a few days prior to surgery.    []$  If you are having an outpatient procedure, you must have a responsible adult bring you to the hospital.  They must remain in the surgical waiting area for the duration of your procedure and provide you with transportation home.  You may not drive yourself home.  We also recommend that you arrange for a responsible person to stay with you for 24 hours following your procedure.  Failure to comply with these instructions may result in cancellation of the procedure.    []$  A parent or legal guardian must accompany minors to the hospital.  LEGAL GUARDIANS MUST bring custody papers with them the day of surgery.    []$  If the lab gives you a blue blood ID band, do not throw it away.  Bring it with you on the day of surgery.      []$  Please return to preop surgical testing no more than 14 days before surgery date to have your type and screen done prior to surgery.    []$  If you have been diagnosed with Sleep Apnea and are using a CPAP, BiPAP, and/or Bi-Flex machine, please bring it with you the day of surgery.    []$  Endoscopy patients, please follow the instructions provided by the doctors office.    []$ If crutches are  ordered, you must get them and be instructed on proper use before the day of surgery.  Please bring them with you the day of surgery.      PREPARING THE SKIN BEFORE SURGERY Can Reduce the Risk of Infection  []$  You have been given a Chlorhexidine skin preparation packet with instructions to bathe the night before surgery and the morning of surgery prior to arrival.    [x]$  If allergic to Chlorhexidine, use Dial (antibacterial) soap to bathe your skin the night before surgery and the morning of surgery.    [x]$  Do not shave your face, underarms, legs or any part of your body at least 48 hours before surgery.  Any clipping needed for surgery will be done at the hospital.      [place]  [x]$  Wear casual, loose fitting comfortable clothes the day of surgery.    [x]$  Remove ALL jewelry, including body piercings.  Leave these and all valuables at home.  Leave suitcases and/or overnight bag at home or in the car for a family member to bring when you get a room.    [x]$  DO NOT wear any makeup, nail polish, deodorant, body lotion, aftershave or contact lenses the day of surgery.  Please bring your glasses and glasses case with you the day of surgery.    [x]$  Bring any additional paperwork, such as doctors orders, consent form, POA (power of attorney), and/or advanced directives with you the day of surgery.    [x]$  Please notify your surgeon of any changes in your condition such as fever, sore throat or rash as soon as possible before your surgery date.  After office hours, call your surgeon's answering service.    [x]$ Bring picture identification and insurance cards with you the day of surgery.      MEDICATIONS:  [x]$  Stop taking aspirin and aspirin products/NSAIDs (non-steroidal anti-inflammatory drugs such as Motrin, Aleve, Advil, ibuprofen and BC Powder), vitamins and herbal pills 2 weeks before surgery OR as instructed by your doctor.  However, if you take a daily aspirin, please contact your primary care  provider (PCP), for instructions prior to surgery.    []$  Contact your doctor regarding any blood thinner medications you take (such as Coumadin, Plavix, etc.) for instructions about what to do prior to surgery.    []$ If you have diabetes, hold oral diabetic medications the night before surgery and the morning of surgery.  Hold insulin the morning of surgery unless told otherwise by your doctor.    []$  If you have asthma or use inhalers please bring them the day of surgery.    [x]$ Take the following medications (oral medications with a sip of water)  the day of surgery:   EFFEXOR        If needed:            Prior to Visit Medications    Medication Sig Taking? Authorizing Provider   aspirin 81 MG EC tablet Take 1 tablet by mouth daily Yes [provider]   estradiol (ESTRACE) 0.1 MG/GM vaginal cream every 7 days Yes [provider]         clobetasol (TEMOVATE) 0.05 % external solution 2 times daily as needed  [provider]   venlafaxine 225 MG extended release tablet Take 1 tablet by mouth daily  [provider]   calcium carb-cholecalciferol (CALCIUM 600+D) 600-20 MG-MCG TABS Take by mouth daily  [provider]   fexofenadine (ALLEGRA) 180 MG tablet Take 1 tablet by mouth daily  [provider]   lansoprazole (PREVACID) 30 MG delayed release capsule Take by mouth daily  [provider]   QUEtiapine (SEROQUEL XR) 50 MG extended release tablet 2 tablets daily  [provider]        *Visitor Policy-Surgical/Procedural Areas: Two people may escort the patient to the department's waiting room and may stay there during the procedure.  One visitor must be 32 years of age or older and the patient must arrive with their post procedure transportation home before procedure start time. The post-procedure transporting party must remain within the surgical waiting room for the duration of the procedure. Failure may result in the cancellation of the  procedure*  Surgical Admitting Unit (SAU)/Phase II/Post Anesthesia Care Unit (PACU): Patients are allowed one visitor at a time. Visitors must be 80 or older. Note: If the patient's time in PACU is expected to be less than one hour, visitation may occur in Phase II Recovery or the patient's room. Visitation is generally limited to 5 - 10 minutes. Pediatric patients are allowed two visitors to accommodate both parents if the patient is hemodynamically stable.    We want you to have a positive experience at [facility].  If any of these these instructions are not met, it is possible that your surgery will be canceled.  If you have any questions regarding your surgery, please call [facility] at [phone] or your surgeon's office for further assistance.           [facility]    [facility]esthesia     There are many ways to perform anesthesia for surgery.  The 2 techniques are generally classified as General Anesthesia and Regional Anesthesia.  While both techniques are very safe, they are distinctly different.  As with any anesthesia, there are risks, which may be increased if you already have heart disease, chronic lung conditions, or other serious medical problems.    General anesthesia puts you to sleep for your surgery.  It acts on your brain and nerves, and affects your entire body.  It may be administered by an injection, or through inhaling medication.  After you are asleep, a breathing tube may be placed in your windpipe to help you breathe during surgery.  General anesthesia may be more appropriate for longer or more involved surgery, especially if the position you will be in surgery is uncomfortable.  With general anesthesia you may experience a sore throat and hoarse voice for a few days, headache, nausea/vomiting, drowsiness, or blood pressure and breathing problems.    Regional anesthesia involves blocking the nerves to a specific area of the body with local anesthesia medication  ("numbing medicine").  It is usually given in conjunction with varying degrees of twilight sedation.  When at all possible, the recommended type of anesthesia for both total knee replacement (TKA) and total hip replacement (THA) is a regional anesthesia technique.  This can be achieved utilizing a spinal block technique, or third placement of an epidural catheter.  In addition, your anesthesiologist may recommend that you have a peripheral nerve block placed to help with pain after the procedure.    -Spinal block-In a spinal block, local anesthetic (i.e. numbing medicine) is injected into the fluid that baths the spinal cord in the lower part of your back.  This produces a rapid numbing effect that wears off in a few hours.  After meeting the anesthesiologist and discussing her medical conditions and history, the anesthesiologist may decide to place a long-acting pain medicine as well.  There are some medical conditions and home medications that may preclude a spinal block.    -Epidural block-An epidural block uses a catheter inserted into your lower back to deliver numbing medicine.  The epidural block in the spinal block are administered in a very similar location and technique; however, the epidural catheter is placed in a slightly different area around the spine as compared to a spinal block.    -Peripheral nerve block (PNB)-For TKA, the anesthesiologist may discuss performing a PNB.  This technique places local anesthetic directly around the major nerves in your thigh, and one or multiple locations.  These blocks numb only the leg that is injected, and do not affect the other leg.  PNB's are used in addition to another anesthesia technique, and are for postoperative pain relief only.    The advantages of regional anesthesia for joint replacement surgery are considered to be significant.  There is a significant reduction in blood loss and blood transfusions, less nausea/vomiting, less drowsiness, improved pain  control after surgery, better diabetes control, and less association with infection.  Additionally, there also are reduced risks (as compared to general anesthesia techniques) with serious medical complications such as  heart attack, stroke, pneumonia, respiratory depression, or development of a blood clot in your legs that can go to your lungs.  Like with any technique, there are risks.  With regional anesthesia, there is a low incidence of headache and nerve damage.  Most commonly though, if the regional technique proves challenging, it is a difficulty in getting the numbing medicine in close proximity to the nerves perform the nerve block.  Extremely rarely (1 in 200,000), there can be a collection of blood from around the nerves.  Some patients may experience difficulty voiding (passing urine) for a period of time after a regional technique.

## 2022-11-23 ENCOUNTER — Inpatient Hospital Stay: Admit: 2022-11-23 | Payer: BLUE CROSS/BLUE SHIELD | Primary: Diagnostic Radiology

## 2022-11-23 DIAGNOSIS — Z01812 Encounter for preprocedural laboratory examination: Secondary | ICD-10-CM

## 2022-11-23 LAB — COMPREHENSIVE METABOLIC PANEL
ALT: 27 U/L (ref 10–49)
AST: 21 U/L (ref 0.0–33.9)
Albumin: 4.1 gm/dl (ref 3.4–5.0)
Alkaline Phosphatase: 67 U/L (ref 46–116)
Anion Gap: 5 mmol/L (ref 5–15)
BUN: 13 mg/dl (ref 9–23)
CO2: 28 mEq/L (ref 20–31)
Calcium: 9.5 mg/dl (ref 8.7–10.4)
Chloride: 109 mEq/L — ABNORMAL HIGH (ref 98–107)
Creatinine: 0.7 mg/dl (ref 0.55–1.02)
GFR African American: >60
GFR Non-African American: >60
Glucose: 104 mg/dl (ref 74–106)
Potassium: 4.1 mEq/L (ref 3.5–5.1)
Sodium: 141 mEq/L (ref 136–145)
Total Bilirubin: 0.2 mg/dl — ABNORMAL LOW (ref 0.30–1.20)
Total Protein: 6.7 gm/dl (ref 5.7–8.2)

## 2022-11-23 LAB — CBC
Hematocrit: 40.4 % (ref 35.0–47.0)
Hemoglobin: 13.5 gm/dl (ref 11.0–16.0)
MCH: 31.8 pg (ref 25.4–34.6)
MCHC: 33.4 gm/dl (ref 30.0–36.0)
MCV: 95.1 fL (ref 80.0–98.0)
MPV: 10.7 fL — ABNORMAL HIGH (ref 6.0–10.0)
Platelets: 210 10*3/uL (ref 140–450)
RBC: 4.25 M/uL (ref 3.60–5.20)
RDW: 46.7 — ABNORMAL HIGH (ref 36.4–46.3)
WBC: 4.7 10*3/uL (ref 4.0–11.0)

## 2022-11-23 LAB — PROTIME-INR
INR: 1 (ref 0.1–1.1)
Protime: 11.4 seconds (ref 10.2–12.9)

## 2022-11-23 LAB — APTT: aPTT: 32.4 seconds (ref 25.1–36.5)

## 2022-11-25 NOTE — Progress Notes (Signed)
PREOPERATIVE INSTRUCTIONS  Please Read Carefully    [x]  Your procedure is scheduled on: 12/02/22     []  The day before your surgery, call the surgeon's office to check on the surgery time and the time you should arrive.     [x]  On the day of your surgery, arrive at the time given to you by your surgeon and check in at the first floor registration desk.    [x]  DO NOT eat anything after midnight before your surgery. This includes gum, mints or hard candy.  May have Water, black coffee (NO CREAM) or regular Gatorade (not sugar free) up to 3 hours prior to surgery, but no more than 6 ounces per hour (approx.  cup).    [x]  You may brush your teeth the morning of surgery, however DO NOT swallow any water.    []  No smoking after midnight.  Smoking should be reduced a few days prior to surgery.    [x]  If you are having an outpatient procedure, you must have a responsible adult bring you to the hospital.  They must remain in the surgical waiting area for the duration of your procedure and provide you with transportation home.  You may not drive yourself home.  We also recommend that you arrange for a responsible person to stay with you for 24 hours following your procedure.  Failure to comply with these instructions may result in cancellation of the procedure.    []  A parent or legal guardian must accompany minors to the hospital.  LEGAL GUARDIANS MUST bring custody papers with them the day of surgery.    []  If the lab gives you a blue blood ID band, do not throw it away.  Bring it with you on the day of surgery.      []  Please return to preop surgical testing no more than 14 days before surgery date to have your type and screen done prior to surgery.    []  If you have been diagnosed with Sleep Apnea and are using a CPAP, BiPAP, and/or Bi-Flex machine, please bring it with you the day of surgery.    []  Endoscopy patients, please follow the instructions provided by the doctors office.    [] If crutches are ordered,  you must get them and be instructed on proper use before the day of surgery.  Please bring them with you the day of surgery.      PREPARING THE SKIN BEFORE SURGERY Can Reduce the Risk of Infection  []  You have been given a Chlorhexidine skin preparation packet with instructions to bathe the night before surgery and the morning of surgery prior to arrival.    [x]  If allergic to Chlorhexidine, use Dial (antibacterial) soap to bathe your skin the night before surgery and the morning of surgery.    [x]  Do not shave your face, underarms, legs or any part of your body at least 48 hours before surgery.  Any clipping needed for surgery will be done at the hospital.      [place]  [x]  Wear casual, loose fitting comfortable clothes the day of surgery.    [x]  Remove ALL jewelry, including body piercings.  Leave these and all valuables at home.  Leave suitcases and/or overnight bag at home or in the car for a family member to bring when you get a room.    [x]  DO NOT wear any makeup, deodorant, body lotion, aftershave or contact lenses the day of surgery.  Please bring your glasses and glasses case with you the day of surgery.    [x]  DO NOT wear any fingernail polish, artificial fingernails, or fingernail decoration (nothing other than your natural nails).  The fingernail is used to measure oxygen delivery to the body, these items inhibit the ability to measure this--surgery cannot be performed without the ability to accurately measure oxygen delivery to the body.    [x]  Bring any additional paperwork, such as doctors orders, consent form, POA (power of attorney), and/or advanced directives with you the day of surgery.    [x]  Please notify your surgeon of any changes in your condition such as fever, sore throat or rash as soon as possible before your surgery date.  After office hours, call your surgeon's answering service.    [x] Bring picture identification and insurance cards with you the day of  surgery.      MEDICATIONS:  [x]  Stop taking aspirin and aspirin products/NSAIDs (non-steroidal anti-inflammatory drugs such as Motrin, Aleve, Advil, ibuprofen and BC Powder), vitamins and herbal pills 2 weeks before surgery OR as instructed by your doctor.  However, if you take a daily aspirin, please contact your primary care provider (PCP), for instructions prior to surgery.    []  Contact your doctor regarding any blood thinner medications you take (such as Coumadin, Plavix, etc.) for instructions about what to do prior to surgery.    [] If you have diabetes, hold oral diabetic medications the night before surgery and the morning of surgery.  Hold insulin the morning of surgery unless told otherwise by your doctor.    []  If you have asthma or use inhalers please bring them the day of surgery.    [x] Take the following medications (oral medications with a sip of water)  the day of surgery:   Allegra, prevacid venlafaxine        If needed:            Prior to Visit Medications    Medication Sig Taking? Authorizing Provider   aspirin 81 MG EC tablet Take 1 tablet by mouth daily  [provider]   clobetasol (TEMOVATE) 0.05 % external solution 2 times daily as needed  [provider]   venlafaxine 225 MG extended release tablet Take 1 tablet by mouth daily  [provider]   calcium carb-cholecalciferol (CALCIUM 600+D) 600-20 MG-MCG TABS Take by mouth daily  [provider]   estradiol (ESTRACE) 0.1 MG/GM vaginal cream every 7 days  [provider]   fexofenadine (ALLEGRA) 180 MG tablet Take 1 tablet by mouth daily  [provider]   lansoprazole (PREVACID) 30 MG delayed release capsule Take by mouth daily  [provider]   QUEtiapine (SEROQUEL XR) 50 MG extended release tablet 2 tablets daily  [provider]        *Visitor Policy-Surgical/Procedural Areas: Two people may escort the patient to the department's waiting room and may stay there  during the procedure.  One visitor must be 12 years of age or older and the patient must arrive with their post procedure transportation home before procedure start time. The post-procedure transporting party must remain within the surgical waiting room for the duration of the procedure. Failure may result in the cancellation of the procedure*  Surgical Admitting Unit (SAU)/Phase II/Post Anesthesia Care Unit (PACU): Patients are allowed one visitor at a time. Visitors must be 25 or older. Note: If the patient's time in PACU is expected to be less than one hour, visitation  may occur in Phase II Recovery or the patient's room. Visitation is generally limited to 5 - 10 minutes. Pediatric patients are allowed two visitors to accommodate both parents if the patient is hemodynamically stable.    We want you to have a positive experience at [facility].  If any of these these instructions are not met, it is possible that your surgery will be canceled.  If you have any questions regarding your surgery, please call [facility] at [phone] or your surgeon's office for further assistance.           [facility]    [facility] Anesthesia     There are many ways to perform anesthesia for surgery.  The 2 techniques are generally classified as General Anesthesia and Regional Anesthesia.  While both techniques are very safe, they are distinctly different.  As with any anesthesia, there are risks, which may be increased if you already have heart disease, chronic lung conditions, or other serious medical problems.    General anesthesia puts you to sleep for your surgery.  It acts on your brain and nerves, and affects your entire body.  It may be administered by an injection, or through inhaling medication.  After you are asleep, a breathing tube may be placed in your windpipe to help you breathe during surgery.  General anesthesia may be more appropriate for longer or more involved surgery, especially if the  position you will be in surgery is uncomfortable.  With general anesthesia you may experience a sore throat and hoarse voice for a few days, headache, nausea/vomiting, drowsiness, or blood pressure and breathing problems.    Regional anesthesia involves blocking the nerves to a specific area of the body with local anesthesia medication ("numbing medicine").  It is usually given in conjunction with varying degrees of twilight sedation.  When at all possible, the recommended type of anesthesia for both total knee replacement (TKA) and total hip replacement (THA) is a regional anesthesia technique.  This can be achieved utilizing a spinal block technique, or third placement of an epidural catheter.  In addition, your anesthesiologist may recommend that you have a peripheral nerve block placed to help with pain after the procedure.    -Spinal block-In a spinal block, local anesthetic (i.e. numbing medicine) is injected into the fluid that baths the spinal cord in the lower part of your back.  This produces a rapid numbing effect that wears off in a few hours.  After meeting the anesthesiologist and discussing her medical conditions and history, the anesthesiologist may decide to place a long-acting pain medicine as well.  There are some medical conditions and home medications that may preclude a spinal block.    -Epidural block-An epidural block uses a catheter inserted into your lower back to deliver numbing medicine.  The epidural block in the spinal block are administered in a very similar location and technique; however, the epidural catheter is placed in a slightly different area around the spine as compared to a spinal block.    -Peripheral nerve block (PNB)-For TKA, the anesthesiologist may discuss performing a PNB.  This technique places local anesthetic directly around the major nerves in your thigh, and one or multiple locations.  These blocks numb only the leg that is injected, and do not affect the other  leg.  PNB's are used in addition to another anesthesia technique, and are for postoperative pain relief only.    The advantages of regional anesthesia for joint replacement surgery are  considered to be significant.  There is a significant reduction in blood loss and blood transfusions, less nausea/vomiting, less drowsiness, improved pain control after surgery, better diabetes control, and less association with infection.  Additionally, there also are reduced risks (as compared to general anesthesia techniques) with serious medical complications such as heart attack, stroke, pneumonia, respiratory depression, or development of a blood clot in your legs that can go to your lungs.  Like with any technique, there are risks.  With regional anesthesia, there is a low incidence of headache and nerve damage.  Most commonly though, if the regional technique proves challenging, it is a difficulty in getting the numbing medicine in close proximity to the nerves perform the nerve block.  Extremely rarely (1 in 200,000), there can be a collection of blood from around the nerves.  Some patients may experience difficulty voiding (passing urine) for a period of time after a regional technique.

## 2022-12-02 ENCOUNTER — Inpatient Hospital Stay
Admit: 2022-12-02 | Discharge: 2022-12-04 | Disposition: A | Discharge status: Home / Self Care | Payer: BLUE CROSS/BLUE SHIELD | Attending: Neurological Surgery | Admitting: Neurological Surgery

## 2022-12-02 ENCOUNTER — Inpatient Hospital Stay: Admit: 2022-12-02 | Payer: BLUE CROSS/BLUE SHIELD | Primary: Diagnostic Radiology

## 2022-12-02 DIAGNOSIS — M48062 Spinal stenosis, lumbar region with neurogenic claudication: Secondary | ICD-10-CM

## 2022-12-02 DIAGNOSIS — M5416 Radiculopathy, lumbar region: Secondary | ICD-10-CM

## 2022-12-02 MED ORDER — SCOPOLAMINE 1 MG/3DAYS TD PT72
1 | TRANSDERMAL | Status: DC
Start: 2022-12-02 — End: 2022-12-02
  Administered 2022-12-02: 13:00:00 1 via TRANSDERMAL

## 2022-12-02 MED ORDER — LACTATED RINGERS IV SOLN
INTRAVENOUS | Status: DC
Start: 2022-12-02 — End: 2022-12-02
  Administered 2022-12-02 (×2): via INTRAVENOUS

## 2022-12-02 MED ORDER — NORMAL SALINE FLUSH 0.9 % IV SOLN
0.9 | Freq: Two times a day (BID) | INTRAVENOUS | Status: DC
Start: 2022-12-02 — End: 2022-12-04
  Administered 2022-12-02 – 2022-12-04 (×5): 10 mL via INTRAVENOUS

## 2022-12-02 MED ORDER — HYDROMORPHONE HCL 1 MG/ML IJ SOLN
1 | INTRAMUSCULAR | Status: DC | PRN
Start: 2022-12-02 — End: 2022-12-04

## 2022-12-02 MED ORDER — KETAMINE HCL 50 MG/ML IV SOSY
50 | INTRAVENOUS | Status: AC
Start: 2022-12-02 — End: ?

## 2022-12-02 MED ORDER — BUPIVACAINE LIPOSOME 1.3 % IJ SUSP
1.3 | INTRAMUSCULAR | Status: AC
Start: 2022-12-02 — End: ?

## 2022-12-02 MED ORDER — PROCHLORPERAZINE EDISYLATE 10 MG/2ML IJ SOLN
10 | Freq: Once | INTRAMUSCULAR | Status: DC | PRN
Start: 2022-12-02 — End: 2022-12-02

## 2022-12-02 MED ORDER — OXYCODONE HCL 5 MG PO TABS
5 | ORAL | Status: DC | PRN
Start: 2022-12-02 — End: 2022-12-04
  Administered 2022-12-02 – 2022-12-04 (×6): 10 mg via ORAL

## 2022-12-02 MED ORDER — VENLAFAXINE HCL ER 150 MG PO CP24
150 | Freq: Every day | ORAL | Status: DC
Start: 2022-12-02 — End: 2022-12-04
  Administered 2022-12-03 – 2022-12-04 (×2): 225 mg via ORAL

## 2022-12-02 MED ORDER — FLEET ENEMA 7-19 GM/118ML RE ENEM
7-19 | Freq: Every day | RECTAL | Status: DC | PRN
Start: 2022-12-02 — End: 2022-12-04

## 2022-12-02 MED ORDER — THROMBIN (RECOMBINANT) 5000 UNITS EX SOLR
5000 | CUTANEOUS | Status: AC
Start: 2022-12-02 — End: ?

## 2022-12-02 MED ORDER — FENTANYL CITRATE (PF) 250 MCG/5ML IJ SOLN
250 | INTRAMUSCULAR | Status: AC
Start: 2022-12-02 — End: ?

## 2022-12-02 MED ORDER — LACTATED RINGERS IV SOLN
INTRAVENOUS | Status: DC
Start: 2022-12-02 — End: 2022-12-04
  Administered 2022-12-02 – 2022-12-03 (×2): via INTRAVENOUS

## 2022-12-02 MED ORDER — BENZOCAINE-MENTHOL 15-3.6 MG MT LOZG
OROMUCOSAL | Status: DC | PRN
Start: 2022-12-02 — End: 2022-12-04

## 2022-12-02 MED ORDER — NORMAL SALINE FLUSH 0.9 % IV SOLN
0.9 | Freq: Two times a day (BID) | INTRAVENOUS | Status: DC
Start: 2022-12-02 — End: 2022-12-02
  Administered 2022-12-02: 17:00:00 10 mL via INTRAVENOUS

## 2022-12-02 MED ORDER — ONDANSETRON 4 MG PO TBDP
4 | Freq: Three times a day (TID) | ORAL | Status: DC | PRN
Start: 2022-12-02 — End: 2022-12-04

## 2022-12-02 MED ORDER — NORMAL SALINE FLUSH 0.9 % IV SOLN
0.9 | INTRAVENOUS | Status: DC | PRN
Start: 2022-12-02 — End: 2022-12-02

## 2022-12-02 MED ORDER — CEFAZOLIN SODIUM-DEXTROSE 2-4 GM/100ML-% IV SOLN
2-4 | INTRAVENOUS | Status: AC
Start: 2022-12-02 — End: 2022-12-02
  Administered 2022-12-02: 13:00:00 2000 mg via INTRAVENOUS

## 2022-12-02 MED ORDER — METHYLPREDNISOLONE ACETATE 40 MG/ML IJ SUSP
40 | INTRAMUSCULAR | Status: AC
Start: 2022-12-02 — End: ?

## 2022-12-02 MED ORDER — ONDANSETRON HCL 4 MG/2ML IJ SOLN
4 | Freq: Four times a day (QID) | INTRAMUSCULAR | Status: DC | PRN
Start: 2022-12-02 — End: 2022-12-04

## 2022-12-02 MED ORDER — HYDROMORPHONE HCL 1 MG/ML IJ SOLN
1 | INTRAMUSCULAR | Status: DC | PRN
Start: 2022-12-02 — End: 2022-12-02
  Administered 2022-12-02 (×2): 0.25 mg via INTRAVENOUS

## 2022-12-02 MED ORDER — FENTANYL CITRATE (PF) 100 MCG/2ML IJ SOLN
100 | INTRAMUSCULAR | Status: DC | PRN
Start: 2022-12-02 — End: 2022-12-02
  Administered 2022-12-02 (×2): 50 ug via INTRAVENOUS

## 2022-12-02 MED ORDER — SUGAMMADEX SODIUM 200 MG/2ML IV SOLN
200 MG/2ML | INTRAVENOUS | Status: DC | PRN
  Administered 2022-12-02: 14:00:00 200 via INTRAVENOUS

## 2022-12-02 MED ORDER — ACETAMINOPHEN 325 MG PO TABS
325 | Freq: Four times a day (QID) | ORAL | Status: DC | PRN
Start: 2022-12-02 — End: 2022-12-04
  Administered 2022-12-04: 12:00:00 650 mg via ORAL

## 2022-12-02 MED ORDER — LIDOCAINE HCL (PF) 1 % IJ SOLN
1 | Freq: Once | INTRAMUSCULAR | Status: AC | PRN
Start: 2022-12-02 — End: 2022-12-02
  Administered 2022-12-02: 12:00:00 1 mL via INTRADERMAL

## 2022-12-02 MED ORDER — ALUM & MAG HYDROXIDE-SIMETH 200-200-20 MG/5ML PO SUSP
200-200-20 | Freq: Four times a day (QID) | ORAL | Status: DC | PRN
Start: 2022-12-02 — End: 2022-12-04

## 2022-12-02 MED ORDER — OXYCODONE HCL 5 MG PO TABS
5 | ORAL | Status: DC | PRN
Start: 2022-12-02 — End: 2022-12-04
  Administered 2022-12-03: 18:00:00 5 mg via ORAL

## 2022-12-02 MED ORDER — MIDAZOLAM HCL 2 MG/2ML IJ SOLN
2 | INTRAMUSCULAR | Status: AC
Start: 2022-12-02 — End: ?

## 2022-12-02 MED ORDER — SODIUM CHLORIDE 0.9 % IV SOLN
0.9 | INTRAVENOUS | Status: DC | PRN
Start: 2022-12-02 — End: 2022-12-04

## 2022-12-02 MED ORDER — QUETIAPINE FUMARATE ER 50 MG PO TB24
50 | Freq: Every day | ORAL | Status: DC
Start: 2022-12-02 — End: 2022-12-04
  Administered 2022-12-03 – 2022-12-04 (×2): 100 mg via ORAL

## 2022-12-02 MED ORDER — PHENYLEPHRINE HCL 10 MG/ML SOLN (MIXTURES ONLY)
10 MG/ML | Status: DC | PRN
  Administered 2022-12-02 (×2): 200 via INTRAVENOUS
  Administered 2022-12-02: 13:00:00 100 via INTRAVENOUS
  Administered 2022-12-02: 14:00:00 200 via INTRAVENOUS
  Administered 2022-12-02: 13:00:00 100 via INTRAVENOUS
  Administered 2022-12-02 (×2): 200 via INTRAVENOUS

## 2022-12-02 MED ORDER — ONDANSETRON HCL 4 MG/2ML IJ SOLN
4 | Freq: Once | INTRAMUSCULAR | Status: DC | PRN
Start: 2022-12-02 — End: 2022-12-02

## 2022-12-02 MED ORDER — CYCLOBENZAPRINE HCL 10 MG PO TABS
10 | Freq: Three times a day (TID) | ORAL | Status: DC | PRN
Start: 2022-12-02 — End: 2022-12-04
  Administered 2022-12-02 – 2022-12-04 (×4): 10 mg via ORAL

## 2022-12-02 MED ORDER — CEFAZOLIN SODIUM 1 G IJ SOLR
1 | INTRAMUSCULAR | Status: AC
Start: 2022-12-02 — End: ?

## 2022-12-02 MED ORDER — OYSTER SHELL CALCIUM W/D 500-5 MG-MCG PO TABS
500-5 | Freq: Every day | ORAL | Status: DC
Start: 2022-12-02 — End: 2022-12-04
  Administered 2022-12-03 – 2022-12-04 (×2): 1 via ORAL

## 2022-12-02 MED ORDER — MIDAZOLAM HCL 2 MG/2ML IJ SOLN
2 | INTRAMUSCULAR | Status: DC | PRN
Start: 2022-12-02 — End: 2022-12-02
  Administered 2022-12-02: 13:00:00 2 via INTRAVENOUS

## 2022-12-02 MED ORDER — DIPHENHYDRAMINE HCL 50 MG/ML IJ SOLN
50 | Freq: Once | INTRAMUSCULAR | Status: DC | PRN
Start: 2022-12-02 — End: 2022-12-02

## 2022-12-02 MED ORDER — ROCURONIUM BROMIDE 50 MG/5ML IV SOLN
50 | INTRAVENOUS | Status: DC | PRN
Start: 2022-12-02 — End: 2022-12-02
  Administered 2022-12-02: 13:00:00 50 via INTRAVENOUS

## 2022-12-02 MED ORDER — EPHEDRINE SULFATE (PRESSORS) 50 MG/ML IV SOLN
50 MG/ML | INTRAVENOUS | Status: DC | PRN
  Administered 2022-12-02 (×3): 10 via INTRAVENOUS

## 2022-12-02 MED ORDER — SODIUM CHLORIDE 0.9 % IV SOLN
0.9 | INTRAVENOUS | Status: DC | PRN
Start: 2022-12-02 — End: 2022-12-02
  Administered 2022-12-02: 13:00:00 40

## 2022-12-02 MED ORDER — ONDANSETRON HCL 4 MG/2ML IJ SOLN
4 | INTRAMUSCULAR | Status: DC | PRN
Start: 2022-12-02 — End: 2022-12-02
  Administered 2022-12-02: 13:00:00 4 via INTRAVENOUS

## 2022-12-02 MED ORDER — LIDOCAINE HCL 2 % IJ SOLN
2 % | INTRAMUSCULAR | Status: DC | PRN
  Administered 2022-12-02: 14:00:00 100 via INTRAVENOUS

## 2022-12-02 MED ORDER — FEXOFENADINE HCL 180 MG PO TABS
180 | Freq: Every day | ORAL | Status: DC
Start: 2022-12-02 — End: 2022-12-04
  Administered 2022-12-02 – 2022-12-04 (×3): 180 mg via ORAL

## 2022-12-02 MED ORDER — DIPHENHYDRAMINE HCL 50 MG/ML IJ SOLN
50 | Freq: Four times a day (QID) | INTRAMUSCULAR | Status: DC | PRN
Start: 2022-12-02 — End: 2022-12-04

## 2022-12-02 MED ORDER — DIPHENHYDRAMINE HCL 25 MG PO CAPS
25 | Freq: Four times a day (QID) | ORAL | Status: DC | PRN
Start: 2022-12-02 — End: 2022-12-04

## 2022-12-02 MED ORDER — CEFAZOLIN SODIUM 1 G IJ SOLR
1 | INTRAMUSCULAR | Status: DC | PRN
Start: 2022-12-02 — End: 2022-12-02
  Administered 2022-12-02: 13:00:00 1000

## 2022-12-02 MED ORDER — METHYLPREDNISOLONE ACETATE 40 MG/ML IJ SUSP
40 | INTRAMUSCULAR | Status: DC | PRN
Start: 2022-12-02 — End: 2022-12-02
  Administered 2022-12-02: 14:00:00 40

## 2022-12-02 MED ORDER — GLYCOPYRROLATE 0.2 MG/ML IJ SOLN
0.2 | INTRAMUSCULAR | Status: DC | PRN
Start: 2022-12-02 — End: 2022-12-02
  Administered 2022-12-02: 13:00:00 .2 via INTRAVENOUS

## 2022-12-02 MED ORDER — VENLAFAXINE HCL ER 225 MG PO TB24
225 | Freq: Every day | ORAL | Status: DC
Start: 2022-12-02 — End: 2022-12-02

## 2022-12-02 MED ORDER — DEXAMETHASONE 4 MG/ML IJ SOLN (MIXTURES ONLY)
4 | INTRAMUSCULAR | Status: DC | PRN
Start: 2022-12-02 — End: 2022-12-02
  Administered 2022-12-02: 13:00:00 10 via INTRAVENOUS

## 2022-12-02 MED ORDER — THROMBIN (RECOMBINANT) 5000 UNITS EX SOLR
5000 | CUTANEOUS | Status: DC | PRN
Start: 2022-12-02 — End: 2022-12-02
  Administered 2022-12-02: 13:00:00 1 via TOPICAL

## 2022-12-02 MED ORDER — CEFAZOLIN SODIUM 1 G IJ SOLR
1 | Freq: Three times a day (TID) | INTRAMUSCULAR | Status: AC
Start: 2022-12-02 — End: 2022-12-03
  Administered 2022-12-02 – 2022-12-03 (×3): 1000 mg via INTRAVENOUS

## 2022-12-02 MED ORDER — SODIUM CHLORIDE 0.9 % IV SOLN
0.9 | INTRAVENOUS | Status: DC | PRN
Start: 2022-12-02 — End: 2022-12-02

## 2022-12-02 MED ORDER — BISACODYL 10 MG RE SUPP
10 | Freq: Every day | RECTAL | Status: DC | PRN
Start: 2022-12-02 — End: 2022-12-04

## 2022-12-02 MED ORDER — OXYCODONE HCL 5 MG PO TABS
5 | Freq: Once | ORAL | Status: DC | PRN
Start: 2022-12-02 — End: 2022-12-02

## 2022-12-02 MED ORDER — NORMAL SALINE FLUSH 0.9 % IV SOLN
0.9 | INTRAVENOUS | Status: DC | PRN
Start: 2022-12-02 — End: 2022-12-04

## 2022-12-02 MED ORDER — FENTANYL CITRATE (PF) 250 MCG/5ML IJ SOLN
250 | INTRAMUSCULAR | Status: DC | PRN
Start: 2022-12-02 — End: 2022-12-02
  Administered 2022-12-02: 14:00:00 50 via INTRAVENOUS
  Administered 2022-12-02: 13:00:00 100 via INTRAVENOUS
  Administered 2022-12-02 (×2): 50 via INTRAVENOUS

## 2022-12-02 MED ORDER — NALOXONE HCL 0.4 MG/ML IJ SOLN
0.4 | INTRAMUSCULAR | Status: DC | PRN
Start: 2022-12-02 — End: 2022-12-02

## 2022-12-02 MED ORDER — PROPOFOL 200 MG/20ML IV EMUL
200 | INTRAVENOUS | Status: DC | PRN
Start: 2022-12-02 — End: 2022-12-02
  Administered 2022-12-02: 14:00:00 50 via INTRAVENOUS
  Administered 2022-12-02: 13:00:00 120 via INTRAVENOUS

## 2022-12-02 MED FILL — KETAMINE HCL 50 MG/ML IV SOSY: 50 MG/ML | INTRAVENOUS | Qty: 1

## 2022-12-02 MED FILL — FLEET ENEMA RE ENEM: 7-19 GM/118ML | RECTAL | Qty: 1

## 2022-12-02 MED FILL — HYDROMORPHONE HCL 1 MG/ML IJ SOLN: 1 MG/ML | INTRAMUSCULAR | Qty: 1

## 2022-12-02 MED FILL — FENTANYL CITRATE (PF) 250 MCG/5ML IJ SOLN: 250 MCG/5ML | INTRAMUSCULAR | Qty: 5

## 2022-12-02 MED FILL — LACTATED RINGERS IV SOLN: INTRAVENOUS | Qty: 1000

## 2022-12-02 MED FILL — CEFAZOLIN SODIUM-DEXTROSE 2-4 GM/100ML-% IV SOLN: 2-4 GM/100ML-% | INTRAVENOUS | Qty: 100

## 2022-12-02 MED FILL — DEPO-MEDROL 40 MG/ML IJ SUSP: 40 MG/ML | INTRAMUSCULAR | Qty: 1

## 2022-12-02 MED FILL — CYCLOBENZAPRINE HCL 10 MG PO TABS: 10 MG | ORAL | Qty: 1

## 2022-12-02 MED FILL — SCOPOLAMINE 1 MG/3DAYS TD PT72: 1 MG/3DAYS | TRANSDERMAL | Qty: 1

## 2022-12-02 MED FILL — FEXOFENADINE HCL 180 MG PO TABS: 180 MG | ORAL | Qty: 1

## 2022-12-02 MED FILL — CEFAZOLIN SODIUM 1 G IJ SOLR: 1 g | INTRAMUSCULAR | Qty: 1000

## 2022-12-02 MED FILL — FENTANYL CITRATE (PF) 100 MCG/2ML IJ SOLN: 100 MCG/2ML | INTRAMUSCULAR | Qty: 2

## 2022-12-02 MED FILL — MIDAZOLAM HCL 2 MG/2ML IJ SOLN: 2 MG/ML | INTRAMUSCULAR | Qty: 2

## 2022-12-02 MED FILL — RECOTHROM 5000 UNITS EX SOLR: 5000 units | CUTANEOUS | Qty: 10000

## 2022-12-02 MED FILL — EXPAREL 1.3 % IJ SUSP: 1.3 % | INTRAMUSCULAR | Qty: 20

## 2022-12-02 MED FILL — OXYCODONE HCL 5 MG PO TABS: 5 MG | ORAL | Qty: 2

## 2022-12-02 NOTE — Progress Notes (Signed)
TRANSFER - OUT REPORT:    Verbal report given to [facility], RN (name) on Hilda Lias  being transferred to [facility] (unit) for routine progression of patient care       Report consisted of patient's Situation, Background, Assessment and   Recommendations(SBAR).     Information from the following report(s) Nurse Handoff Report, Adult Overview, Surgery Report, Intake/Output, and MAR was reviewed with the receiving nurse.    Opportunity for questions and clarification was provided.      Patient transported with:   O2 @ 3 liters  [employer]

## 2022-12-02 NOTE — Other (Signed)
12/02/22 1114   Family Communication   Contact Person Relationship to Patient Spouse  Patriece Danese)   Contact Person Phone Number [phone]   Family/Significant Other Update Called;Updated  (Sent to Room 2125)   Delivery Origin Nurse   Update Given Yes   Family Communication   Family Update Message Patient stable

## 2022-12-02 NOTE — Anesthesia Pre-Procedure Evaluation (Signed)
Department of Anesthesiology  Preprocedure Note       Name:  Christina Bush   Age:  62 y.o.  DOB:  [birth year]                                          MRN:  [id number]         Date:  12/02/2022      Surgeon: Juliann Mule):  Skidmore, Gilda Crease, MD    Procedure: Procedure(s):  LUMBAR LAMINECTOMY; FACETECTOMY L4-5; PEDICLE SCREWS L4-S1; FUSION L4-S1    Medications prior to admission:   Prior to Admission medications    Medication Sig Start Date End Date Taking? Authorizing Provider   aspirin 81 MG EC tablet Take 1 tablet by mouth daily 03/06/22   [provider]   clobetasol (TEMOVATE) 0.05 % external solution 2 times daily as needed 09/24/22   [provider]   venlafaxine 225 MG extended release tablet Take 1 tablet by mouth daily    [provider]   calcium carb-cholecalciferol (CALCIUM 600+D) 600-20 MG-MCG TABS Take by mouth daily    [provider]   estradiol (ESTRACE) 0.1 MG/GM vaginal cream every 7 days 11/03/21   [provider]   fexofenadine (ALLEGRA) 180 MG tablet Take 1 tablet by mouth daily    [provider]   lansoprazole (PREVACID) 30 MG delayed release capsule Take by mouth daily 09/23/22   [provider]   QUEtiapine (SEROQUEL XR) 50 MG extended release tablet 2 tablets daily 10/19/22   [provider]       Current medications:    Current Facility-Administered Medications   Medication Dose Route Frequency Provider Last Rate Last Admin    ceFAZolin (ANCEF) 2000 mg in dextrose 4 % 100 mL IVPB (premix)  2,000 mg IntraVENous On Call to Laurence [place], MD   Held at 12/02/22 [id number]    lactated ringers IV soln infusion   IntraVENous Continuous Noelle Penner, MD 25 mL/hr at 12/02/22 0740 New Bag at 12/02/22 0740       Allergies:    Allergies   Allergen Reactions    Morphine And Related Nausea And Vomiting       Problem List:    Patient Active Problem List   Diagnosis Code    Lumbar radiculopathy M54.16       Past Medical History:         Diagnosis Date    Arthritis     GENERALIZED OSTEOARTHRITIS    PONV (postoperative nausea and vomiting)     Prolonged emergence from general anesthesia     PVC (premature ventricular contraction) 2017    NOT TREATED    Skin cancer     LEFT EAR, BILATERAL LEG, WRIST  SQUAMOUS & BASAL       Past Surgical History:        Procedure Laterality Date    CHOLECYSTECTOMY      HYSTERECTOMY (CERVIX STATUS UNKNOWN)      LUMBAR LAMINECTOMY      X2    ROTATOR CUFF REPAIR Right     TOTAL KNEE ARTHROPLASTY Right 03/06/2022       Social History:    Social History     Tobacco Use    Smoking status: Never    Smokeless tobacco: Never   Substance Use Topics    Alcohol use: Not Currently  Counseling given: Not Answered      Vital Signs (Current):   Vitals:    11/25/22 1035 12/02/22 0726   BP:  137/72   Pulse:  90   Resp:  18   Temp:  97.8 F (36.6 C)   TempSrc:  Oral   SpO2:  96%   Weight: 93 kg (205 lb) 94.6 kg (208 lb 8.9 oz)   Height: 1.727 m (5\' 8" )                                               BP Readings from Last 3 Encounters:   12/02/22 137/72       NPO Status: Time of last liquid consumption: 0430                        Time of last solid consumption: 2100                        Date of last liquid consumption: 12/02/22                        Date of last solid food consumption: 12/01/22    BMI:   Wt Readings from Last 3 Encounters:   12/02/22 94.6 kg (208 lb 8.9 oz)     Body mass index is 31.71 kg/m.    CBC:   Lab Results   Component Value Date/Time    WBC 4.7 11/23/2022 09:54 AM    RBC 4.25 11/23/2022 09:54 AM    HGB 13.5 11/23/2022 09:54 AM    HCT 40.4 11/23/2022 09:54 AM    MCV 95.1 11/23/2022 09:54 AM    RDW 46.7 11/23/2022 09:54 AM    PLT 210 11/23/2022 09:54 AM       CMP:   Lab Results   Component Value Date/Time    NA 141 11/23/2022 09:54 AM    K 4.1 11/23/2022 09:54 AM    CL 109 11/23/2022 09:54 AM    CO2 28 11/23/2022 09:54 AM    BUN 13 11/23/2022 09:54 AM    CREATININE 0.70 11/23/2022  09:54 AM    GFRAA >60.0 11/23/2022 09:54 AM    LABGLOM >60 11/23/2022 09:54 AM    GLUCOSE 104 11/23/2022 09:54 AM    CALCIUM 9.5 11/23/2022 09:54 AM    BILITOT 0.20 11/23/2022 09:54 AM    ALKPHOS 67 11/23/2022 09:54 AM    AST 21.0 11/23/2022 09:54 AM    ALT 27 11/23/2022 09:54 AM       POC Tests: No results for input(s): "POCGLU", "POCNA", "POCK", "POCCL", "POCBUN", "POCHEMO", "POCHCT" in the last 72 hours.    Coags:   Lab Results   Component Value Date/Time    PROTIME 11.4 11/23/2022 09:54 AM    INR 1.0 11/23/2022 09:54 AM    APTT 32.4 11/23/2022 09:54 AM       HCG (If Applicable): No results found for: "PREGTESTUR", "PREGSERUM", "HCG", "HCGQUANT"     ABGs: No results found for: "PHART", "PO2ART", "PCO2ART", "HCO3ART", "BEART", "O2SATART"     Type & Screen (If Applicable):  No results found for: "LABABO", "LABRH"    Drug/Infectious Status (If Applicable):  No results found for: "HIV", "HEPCAB"    COVID-19 Screening (If Applicable): No results found for: "COVID19"  Anesthesia Evaluation  Patient summary reviewed and Nursing notes reviewed   history of anesthetic complications: PONV.  Airway: Mallampati: II  TM distance: >3 FB   Neck ROM: full  Mouth opening: > = 3 FB   Dental: normal exam   (+) caps      Pulmonary:Negative Pulmonary ROS breath sounds clear to auscultation                             Cardiovascular:Negative CV ROS  Exercise tolerance: good (>4 METS)        ECG reviewed  Rhythm: regular                      Neuro/Psych:   (+) depression/anxiety              ROS comment: Lumbar radiculopathy. GI/Hepatic/Renal:   (+) GERD:          Endo/Other:    (+) : arthritis: OA., malignancy/cancer (H/o skin cancer.).                 Abdominal:             Vascular: negative vascular ROS.         Other Findings:             Anesthesia Plan      general     ASA 2       Induction: intravenous.    MIPS: Postoperative opioids intended and Prophylactic antiemetics administered.  Anesthetic plan and risks discussed  with patient.      Plan discussed with CRNA.    Attending anesthesiologist reviewed and agrees with Preprocedure content                Arletha Pili, MD   12/02/2022

## 2022-12-02 NOTE — Anesthesia Post-Procedure Evaluation (Signed)
Department of Anesthesiology  Postprocedure Note    Patient: Christina Bush  MRN: [id number]  Birthdate: 1961/02/08  Date of evaluation: 12/02/2022    Procedure Summary       Date: 12/02/22 Room / Location: [facility] / [facility] OR    Anesthesia Start: [id number] Anesthesia Stop: [id number]    Procedure: LUMBAR LAMINECTOMY; FACETECTOMY L4-5; PEDICLE SCREWS L4-S1; FUSION L4-S1 (Spine Lumbar) Diagnosis:       Lumbar stenosis with neurogenic claudication      Lumbar radiculopathy      (Lumbar stenosis with neurogenic claudication [M48.062])      (Lumbar radiculopathy [M54.16])    Surgeons: Jama Flavors, MD Responsible Provider: Noelle Penner, MD    Anesthesia Type: General ASA Status: 2            Anesthesia Type: General    Aldrete Phase I: Aldrete Score: 9    Aldrete Phase II:      Anesthesia Post Evaluation    Patient location during evaluation: PACU  Patient participation: complete - patient participated  Level of consciousness: awake  Airway patency: patent  Nausea & Vomiting: no vomiting  Cardiovascular status: blood pressure returned to baseline  Respiratory status: acceptable  Hydration status: euvolemic  Multimodal analgesia pain management approach  Pain management: adequate    No notable events documented.

## 2022-12-02 NOTE — Progress Notes (Signed)
Ride[employer]be [place] Bence [id number]

## 2022-12-02 NOTE — Consults (Signed)
Consult Note    Patient: Christina Bush               Sex: female          DOA: 12/02/2022         Date of Birth:  1960/12/21      Age:  62 y.o.        LOS:  LOS: 0 days              Consulting Physician: Jama Flavors, MD    Reason for Consult: Medical Management    HPI:     Christina Bush is a 62 y.o. female who has been seen for medical management  Patient was seen and examined.  Patient was complaining of some back pain she rated pain as 5-6/10 current pain medicines are helping.  Patient denies chest pain or shortness of breath has occasional heartburn no nausea vomiting  Past Medical History:   Diagnosis Date    Arthritis     GENERALIZED OSTEOARTHRITIS    PONV (postoperative nausea and vomiting)     Prolonged emergence from general anesthesia     PVC (premature ventricular contraction) 2017    NOT TREATED    Skin cancer     LEFT EAR, BILATERAL LEG, WRIST  SQUAMOUS & BASAL       Prior to Admission medications    Medication Sig Start Date End Date Taking? Authorizing Provider   aspirin 81 MG EC tablet Take 1 tablet by mouth daily 03/06/22   [provider]   clobetasol (TEMOVATE) 0.05 % external solution 2 times daily as needed 09/24/22   [provider]   venlafaxine 225 MG extended release tablet Take 1 tablet by mouth daily    [provider]   calcium carb-cholecalciferol (CALCIUM 600+D) 600-20 MG-MCG TABS Take by mouth daily    [provider]   estradiol (ESTRACE) 0.1 MG/GM vaginal cream every 7 days 11/03/21   [provider]   fexofenadine (ALLEGRA) 180 MG tablet Take 1 tablet by mouth daily    [provider]   lansoprazole (PREVACID) 30 MG delayed release capsule Take by mouth daily 09/23/22   [provider]   QUEtiapine (SEROQUEL XR) 50 MG extended release tablet 2 tablets daily 10/19/22   [provider]       Allergies   Allergen Reactions    Morphine And Related Nausea And Vomiting       Past Surgical History:   Procedure  Laterality Date    CHOLECYSTECTOMY      HYSTERECTOMY (CERVIX STATUS UNKNOWN)      LUMBAR LAMINECTOMY      X2    ROTATOR CUFF REPAIR Right     TOTAL KNEE ARTHROPLASTY Right 03/06/2022       Family History   Problem Relation Age of Onset    Heart Disease Mother     Esophageal Cancer Father     Lung Cancer Sister     Lung Cancer Brother     High Blood Pressure Brother        Social History     Socioeconomic History    Marital status: Married     Spouse name: None    Number of children: None    Years of education: None    Highest education level: None   Tobacco Use    Smoking status: Never    Smokeless tobacco: Never   Vaping Use    Vaping Use:  Never used   Substance and Sexual Activity    Alcohol use: Not Currently    Drug use: Not Currently         Subjective:   Back pain    Review of Systems:    CONSTITUTIONAL: No Fever, No chills, No weight loss, No Night sweats  HEENT: No nasal discharge, No PN drainage, No epistaxis, No diff in swallowing  CVS: No chest pain, No palpitations, No syncope, No peripheral edema,   RS: No shortness of breath, No cough, No hemoptysis, No pleuritic chest pain  GI: No abd pain, No Nausea, No vomitting, No diarrhea, No rectal bleeding, No acid reflux or heartburn  NEURO: No focal weakness, No headaches, No seizures  PSYCH: No anxiety, No depression  MUSCULOSKLETAL: No joint pain or swelling  GU: No hematuria or dysuria  SKIN: No rash  SLEEP: No snoring, No witnessed apneas, No daytime somnolence or tiredness    Objective:   Visit Vitals  BP (!) 128/57   Pulse 80   Temp 97.7 F (36.5 C) (Oral)   Resp 16   Ht 1.727 m (5\' 8" )   Wt 94.6 kg (208 lb 8.9 oz)   SpO2 93%   BMI 31.71 kg/m       Physical Exam:  General appearance: Very pleasant middle-aged female well-built in mild distress  Head: Normocephalic, without obvious abnormality, atraumatic  Eyes: Conjunctiva pink, PERLA, EOMI  Neck: supple, trachea midline, No JVD, no bruit  Lungs: Clear to auscultation  Heart: Regular rhythm, no  murmur  Abdomen: soft, nontender bowel sounds audible  Extremities:No edema, distal pulses palpable  Skin: Skin color, texture, turgor normal.   Neurological :Awake and alert  Answering questions appropriately  Intake and Output:  Current Shift:  03/20 0701 - 03/20 1900  In: [id number] [P.O.:240; I.V.:1350]  Out: 120 [Urine:120]  Last three shifts:  No intake/output data recorded.    Lab/Data Reviewed:  No results found for this or any previous visit (from the past 48 hour(s)).    Imaging:  @[id number] @    Medications Reviewed:  Current Facility-Administered Medications   Medication Dose Route Frequency    oyster shell calcium w/D 500-5 MG-MCG tablet 1 tablet  1 tablet Oral Daily    fexofenadine (ALLEGRA) tablet 180 mg  180 mg Oral Daily    QUEtiapine (SEROQUEL XR) extended release tablet 100 mg  100 mg Oral Daily    lactated ringers IV soln infusion   IntraVENous Continuous    sodium chloride flush 0.9 % injection 5-40 mL  5-40 mL IntraVENous 2 times per day    sodium chloride flush 0.9 % injection 5-40 mL  5-40 mL IntraVENous PRN    0.9 % sodium chloride infusion   IntraVENous PRN    ceFAZolin (ANCEF) 1,000 mg in sodium chloride 0.9 % 50 mL IVPB (mini-bag)  1,000 mg IntraVENous Q8H    acetaminophen (TYLENOL) tablet 650 mg  650 mg Oral Q6H PRN    oxyCODONE (ROXICODONE) immediate release tablet 5 mg  5 mg Oral Q4H PRN    Or    oxyCODONE (ROXICODONE) immediate release tablet 10 mg  10 mg Oral Q4H PRN    HYDROmorphone (DILAUDID) injection 1 mg  1 mg IntraVENous Q4H PRN    aluminum & magnesium hydroxide-simethicone (MAALOX) 200-200-20 MG/5ML suspension 15 mL  15 mL Oral Q6H PRN    diphenhydrAMINE (BENADRYL) capsule 25 mg  25 mg Oral Q6H PRN    Or    diphenhydrAMINE (BENADRYL) injection  25 mg  25 mg IntraVENous Q6H PRN    ondansetron (ZOFRAN-ODT) disintegrating tablet 4 mg  4 mg Oral Q8H PRN    Or    ondansetron (ZOFRAN) injection 4 mg  4 mg IntraVENous Q6H PRN    bisacodyl (DULCOLAX) suppository 10 mg  10 mg Rectal Daily PRN     sodium phosphate (FLEET) rectal enema 1 enema  1 enema Rectal Daily PRN    cyclobenzaprine (FLEXERIL) tablet 10 mg  10 mg Oral Q8H PRN    benzocaine-menthol (CEPACOL SORE THROAT) lozenge 1 lozenge  1 lozenge Oral Q2H PRN    [START ON 12/03/2022] venlafaxine (EFFEXOR XR) extended release capsule 225 mg  225 mg Oral Daily with breakfast         Assessments:  1.  Depression  2.  GERD  3.  Degenerative arthritis generalized  4.  Spinal stenosis  5.  Obesity    Recommendations:  1.  Patient will be continued on her home medication for depression patient be continued on Effexor to 25 mg daily.  Patient was advised incentive spirometry every 2 hourly.  PT/OT consult done patient was encouraged to participate  For GERD patient be continued on PPI.  Patient previous medical records were reviewed in detail.  Pain management DVT prophylaxis per surgery.  I want to thank Dr. Joseph Berkshire for allowing me to participate in this patient medical management will follow with you.  Patient had impaired fasting glucose we will check hemoglobin A1c in the morning        Dragon medical dictation software was used for portions of this report. Unintended errors may occur.     Elon Spanner, MD  P# [phone]

## 2022-12-02 NOTE — Op Note (Signed)
[facility]  Operation Report  NAME:  Christina Bush, [name]  SEX:   F  DATE: 12/02/2022  DOB: 10-01-1960  MR#    [id number]  ROOM:  PL  ACCT#  [id number]    cc: Lorita Officer MD      PREOPERATIVE DIAGNOSES:   1.  Severe biforaminal stenosis, L5-S1.  2.  Foraminal stenosis, right greater than left, L4-L5.     POSTOPERATIVE DIAGNOSES:   1.  Severe biforaminal stenosis, L5-S1.  2.  Foraminal stenosis, right greater than left, L4-L5.     OPERATION PERFORMED:   1.  Decompression with laminectomy and medial facetectomy, L4, L5, S1.  2.  Pedicle screw stabilization, L4 to S1.  3.  Arthrodesis, posterolateral transverse process technique, L4 to S1.     SURGEON:   Lorita Officer, M.D.     ASSISTANT:   Theodoro Kos, PA-C     ANESTHETIC:   General.     ESTIMATED BLOOD LOSS:   70 mL     SPECIMEN:   None.     COMPLICATIONS:   None.     HISTORY:   The patient with 2 prior surgeries at L4-L5, L5-S1 in pain management, increasing radicular symptoms, severe biforaminal stenosis, L5-S1, significant foraminal stenosis, L4-L5, right greater than left side, who is now for decompression and stabilization.     OPERATIVE NOTE:   After induction of general anesthesia and placement of appropriate lines, the patient was positioned prone on the Wilson frame and all pressure points padded.  Skin along the lumbosacral region was prepped.  She was draped in sterile fashion.  C-arm was draped into the field.  A linear incision was made following her old incision, dissecting down to the fascia.  The fascia was opened with Bovie cautery.  Paraspinous muscles and their attached dissected free from the remaining spinous process of L3 and L4 out over the facets L4-L5, L5-S1.  Self-retaining retractor were placed.  X-ray confirmation was performed.  Scar tissue was freed from the facet complexes bilaterally, L4-L5, L5-S1, dissecting medially to the laminectomy defect at the previous surgical site.  The mesial facets the remaining lamina were  drilled to the foramen, L4-L5, L5-S1, exposing first the nerve root L5-S1 on the left.  It was noted to be significantly compressed and was decompressed along its course with the Kerrison rongeurs.  The same procedure was performed on the right side, decompressing the nerves L4-L5, L5-S1 removing the mesial facets remaining lamina markedly compressed particularly L4-L5 in the canal and the foramen on the right, but severely L5-S1 on the right as well.  Once the nerve roots were completely decompressed, the pedicles of L4, L5, S1 were localized anatomically and with C-arm guidance, cortex was drilled and each hole was tapped.  Then, 6.5 x 45 mm screws were placed at L4-L5, 7.5 x 45 mm screws were placed at S1.  Two rods fashioned and fit appropriately, secured into place.  All points of attachment were torqued.  The system was cross-linked and the crosslink was torqued.  Wound was irrigated copiously, hemostasis obtained.  The wound was closed with 0 Vicryl in deep layer, 2-0 Vicryl in the subcutaneous layer, staples on the skin.  The patient tolerated the procedure well.  There were no complications.      ___________________  Jama Flavors MD   Dictated [id number] A. Beautifull Cisar, MD  SB  D: 12/02/2022 10:54:35  T: 12/02/2022 11:14:37  [id number]

## 2022-12-02 NOTE — Brief Op Note (Signed)
BRIEF OPERATIVE NOTE    Date of Procedure: 12-02-22  Preoperative Diagnosis: Lumbar stenosis with neurogenic claudication [M48.062]  Lumbar radiculopathy [M54.16]  Postoperative Diagnosis: * No post-op diagnosis entered *    Procedure: Procedure(s):  LUMBAR LAMINECTOMY; FACETECTOMY L4-5; PEDICLE SCREWS L4-S1; FUSION L4-S1  Surgeon(s) and Role:     * Skidmore, Gilda Crease, MD - Primary  Circulator: Letta Pate, RN  Radiology Technologist: Consuello Closs Person First: [place], [place]  Physician Assistant: Demetrius Charity, PA-C  Anesthesia: General   Estimated Blood Loss: 90cc  Specimens: * No specimens in log *   Findings: none   Complications: one  Implants:   Implant Name Type Inv. Item Serial No. Manufacturer Lot No. LRB No. Used Action   POWDER SURGICAL CELLERATE RX 5 GM - [id number] Collagen POWDER SURGICAL CELLERATE RX 5 GM  [employer]_CR [id number] N/A 1 Implanted   FILLER BONE MICROFIBER SYNTHETIC REBOSIS 50CC - [id number] Bone/Graft/Tissue/Human/Synth FILLER BONE MICROFIBER SYNTHETIC REBOSIS 50CC  [employer] [place]_CR [id number] N/A 1 Implanted   FILLER BONE MICROFIBER SYNTHETIC REBOSIS 50CC - [id number] Bone/Graft/Tissue/Human/Synth FILLER BONE MICROFIBER SYNTHETIC REBOSIS 50CC  [employer]BIR[employer] [place]_CR [id number] N/A 1 Implanted   GRAFT BONE MATRIX INFLUX SPARC 10ML - [id number] Bone/Graft/Tissue/Human/Synth GRAFT BONE MATRIX INFLUX SPARC 10ML  [employer]CR [id number] N/A 1 Implanted   GRAFT BONE MATRIX INFLUX SPARC 10ML - [id number] Bone/Graft/Tissue/Human/Synth GRAFT BONE MATRIX INFLUX SPARC 10ML  [employer]CR [id number] N/A 1 Implanted   ALLOGRAFT BONE INFLUX FIBRANT 10CM 2-LEVEL - [id number] Bone/Graft/Tissue/Human/Synth ALLOGRAFT BONE INFLUX FIBRANT 10CM 2-LEVEL [id number] [employer]_CR  N/A 1 Implanted   PUTTY DBM FLOWABLE 5CC - [id number] Bone/Graft/Tissue/Human/Synth PUTTY DBM FLOWABLE 5CC [id number] [employer]_CR  N/A 1  Implanted   PUTTY DBM FLOWABLE 5CC - [id number] Bone/Graft/Tissue/Human/Synth PUTTY DBM FLOWABLE 5CC [id number] [employer]_CR  N/A 1 Implanted   SCREW SPINAL PEDICLE 6.5X45MM - [id number] Screw/Plate/Nail/Rod SCREW SPINAL PEDICLE 6.5X45MM  [employer]_CR NA N/A 4 Implanted   SCREW PEDICLE 7.5X45MM - [id number] Screw/Plate/Nail/Rod SCREW PEDICLE 7.5X45MM  [employer]_CR NA N/A 2 Implanted   SET SCREW TIGER SPINE 5500 SERIES - [id number] Screw/Plate/Nail/Rod SET SCREW TIGER SPINE 5500 SERIES  [employer]_CR NA N/A 6 Implanted   CROSS CONNECTOR SMALL 25-30 - [id number] Connector CROSS CONNECTOR SMALL 25-30  [employer]_CR NA N/A 1 Implanted   ROD 65MM [id number] Screw/Plate/Nail/Rod ROD 65MM  [employer]_CR NA N/A 2 Implanted

## 2022-12-02 NOTE — H&P (Signed)
Update History & Physical    The patient's History and Physical was reviewed with the patient and I examined the patient. There was no change. The surgical site was confirmed by the patient and me.       Plan: The risks, benefits, expected outcome, and alternative to the recommended procedure have been discussed with the patient. Patient understands and wants to proceed with the procedure.     Electronically signed by Christian Mate Jermine Bibbee, PA-C on 12/02/2022 at 8:21 AM

## 2022-12-02 NOTE — Progress Notes (Signed)
Per pt and family, pt has a family history of hematoma after back surgery. RN checked dressing on back which is clean, dry and intact. No bulging or any signs of hematoma. Will continue to monitor.

## 2022-12-03 LAB — HEMOGLOBIN A1C: Hemoglobin A1C: 5.6 % (ref 3.8–5.6)

## 2022-12-03 MED FILL — CYCLOBENZAPRINE HCL 10 MG PO TABS: 10 MG | ORAL | Qty: 1

## 2022-12-03 MED FILL — VENLAFAXINE HCL ER 37.5 MG PO CP24: 37.5 MG | ORAL | Qty: 2

## 2022-12-03 MED FILL — CEFAZOLIN SODIUM 1 G IJ SOLR: 1 g | INTRAMUSCULAR | Qty: 1000

## 2022-12-03 MED FILL — FEXOFENADINE HCL 180 MG PO TABS: 180 MG | ORAL | Qty: 1

## 2022-12-03 MED FILL — SODIUM CHLORIDE FLUSH 0.9 % IV SOLN: 0.9 % | INTRAVENOUS | Qty: 10

## 2022-12-03 MED FILL — OXYCODONE HCL 5 MG PO TABS: 5 MG | ORAL | Qty: 2

## 2022-12-03 MED FILL — QUETIAPINE FUMARATE ER 50 MG PO TB24: 50 MG | ORAL | Qty: 2

## 2022-12-03 MED FILL — OYSTER SHELL CALCIUM/D3 500-5 MG-MCG PO TABS: 500-5 MG-MCG | ORAL | Qty: 1

## 2022-12-03 MED FILL — OXYCODONE HCL 5 MG PO TABS: 5 MG | ORAL | Qty: 1

## 2022-12-03 NOTE — Progress Notes (Signed)
Chaplain Services  Initial Visit    Start Time: 0935  End Time: 0940    Active listening.  Offered prayer and assurance of continued prayers on patient's behalf.   Chart reviewed.    The following outcomes where achieved:  Patient shared limited information about both their medical narrative and spiritual journey/beliefs.  Chaplain confirmed Patient's Religious Affiliation.  Patient processed feeling about current hospitalization.  Patient expressed gratitude for chaplain's visit.    Assessment:  Patient does not have any religious/cultural needs that will affect patient's preferences in health care.  There are no spiritual or religious issues which require intervention at this time.     Plan:  Chaplains will continue to follow and will provide pastoral care on an as needed/requested basis.  Chaplain recommends bedside caregivers page chaplain on duty if patient shows signs of acute spiritual or emotional distress.  Chaplain charted for [employer].    [place]  [facility]  [phone]

## 2022-12-03 NOTE — Progress Notes (Signed)
Progress Note      Patient: Christina Bush               Sex: female          DOA: 12/02/2022         Date of Birth:  [birth year]      Age:  62 y.o.         LOS: 1 day               Subjective:     Patient seen and evaluated by Dr Joseph Berkshire this am.  Doing well.  No LE complaints  C/O incisional pain    Objective:      Visit Vitals  BP (!) 115/58   Pulse 72   Temp 97.7 F (36.5 C) (Temporal)   Resp 16   Ht 1.727 m (5\' 8" )   Wt 94.6 kg (208 lb 8.9 oz)   SpO2 92%   BMI 31.71 kg/m         Physical Exam:  Normal sensory and motor exam  Wound/dressing C/D/I  Afebrile        Assessment/Plan     Principal Problem:    Lumbar radiculopathy  Resolved Problems:    * No resolved hospital problems. *      Activity as tolerated.  Encourage IS.

## 2022-12-03 NOTE — Progress Notes (Signed)
PHYSICAL THERAPY EVALUATION     Acknowledge Orders  Time  PT Charge Capture  Rehab Caseload Tracker  [facility] AM-PAC "6 Clicks" Basic Mobility Inpatient Short Form      Patient: Christina Bush [id number] y.o. female)  Room: 2125/2125    Primary Diagnosis: Lumbar stenosis with neurogenic claudication [M48.062]  Lumbar radiculopathy [M54.16]   Procedure(s) (LRB):  LUMBAR LAMINECTOMY; FACETECTOMY L4-5; PEDICLE SCREWS L4-S1; FUSION L4-S1 (N/A) 1 Day Post-Op  Date of Admission: 12/02/2022   Length of Stay:  1 day(s)  Insurance: Payor: BCBS / Plan: BCBS OUT OF STATE / Product Type: *No Product type* /      Date: 12/03/2022  Time In: 1315       Time Out: 1353   Total Minutes: 38  Treatment Time: Patient received/participated in 25 minutes of treatment  functional mobility training, bed mobility training, transfer training, education for positioning, education for logroll technique and positioning, gait training, balance training, therapeutic exercises, therapeutic activities, AD training, patient education, caregiver education) during/immediately following PT evaluation.    Isolation:  No active isolations       MDRO: No active infections  Current diet order: ADULT DIET; Regular    Precautions: falls, spinal:  no bending, no lifting, no twisting  " BLT", LSO  Ordered weight bearing status: no weight bearing restriction     ASSESSMENT:          Based on the objective data described below, the patient presents with:    - pt stated post op pain as 3/10  - pt demo supine to sit with CGA with good logroll technique  - pt demo sit to stand with CGA with walker  - pt ambulated 72' with walker with CGA    Patient's rehabilitation potential for below stated goals: excellent    Recommendations:  Recommend continued physical therapy during acute stay. Physical Therapy and Occupational Therapy. Recommend out of bed activity to counteract ill effects of bedrest, with assistance from staff as needed.  Discharge Recommendations:  family support.  Further Equipment Recommendations for Discharge:     Recommend a bedside commode to provide patient safety and increased independence, following post op precautions, and help prevent falls while using the restroom.        Functional Outcome Measure:    AM-PAC: AM-PAC Inpatient Mobility Raw Score : 18  -  Home:   At this time and based on an AM-PAC score, no further PT is recommended upon discharge due to (i.e. patient at baseline functional status).  Recommend patient returns to prior setting with prior services.    This AMPAC score should be considered in conjunction with interdisciplinary team recommendations to determine the most appropriate discharge setting. Patient's social support, diagnosis, medical stability, and prior level of function should also be taken into consideration.      PRIOR LEVEL OF FUNCTION / HOME ENVIRONMENT:      Information was obtained by patient and patient's spouse at bedside    Home environment:  Patient lives with spouse in 1 story house with 1 step to enter  Prior level of function: independent  Home equipment: rolling walker    PLAN:      Patient will benefit from skilled Physical Therapy intervention to address the above impairments to return to prior level of function. PT Plan of Care: Daily.    Patient will achieve PT goals in 1 week.     PHYSICAL THERAPY GOALS:     - Patient will be  independent with bed mobility in preparation for EOB activities.   - Patient will be independent with transfers in preparation for OOB activities and ambulation.  - Patient will tolerate sitting up in chair for meals.  - Patient will be independent for ambulating at least 200 feet with the least restrictive device to promote functional independence at home.   - Patient will demonstrate at least good balance to safely enable upright activities and reduce risk for falls.  - Patient will demonstrate good activity tolerance during functional activities.  - Patient will demonstrate good  safety awareness during functional activities.  - Patient's pain will be below 5/10 to participate in activities.   - Patient will be independent with lower extremity home exercise program to increase strength and endurance.  - Patient will state/observe Spine precautions and Back brace  precautions.  - Patient will utilize energy conservation techniques during functional activities.  - Patient will increase FOM score to at least 21/24 in order to increase independence and safety with functional mobility.     PLANNED INTERVENTIONS:      Skilled Physical Therapy services will provide functional mobility training, therapeutic exercises, therapeutic activities, patient/caregiver education as indicated.  Skilled Physical Therapy services will modify and progress therapeutic interventions, address functional mobility deficits, address ROM and strength deficits, analyze and cue movement patterns and assess and modify postural abnormalities to reach the stated goals.    COMMUNICATION/EDUCATION:     Education: Ill effects of bedrest, benefits of activity, OOB activity as tolerated, with assist from staff as appropriate, OOB to chair for meals and mobilize as tolerated, activity as tolerated to counteract ill effects of bedrest, promote healing and return to prior level of function, call staff for assistance, safety, ankle pumps to promote improved circulation and prevent DVTs, spinal precautions (no "BLT": no bending, no lifting, no twisting) and their impact on functional tasks; logroll technique and positioning, brace management, use of ice, positioning, DME, car transfers.    Education provided to: patient and patient's spouse at bedside  Opportunity for questions and clarification was provided.    Readiness to learn indicated by: verbalized understanding    Barriers to learning/limitations: none    Comprehension: In agreement      SUBJECTIVE:      Patient: pt stated she has been up and walking earlier today and  yesterday.  Patient reports 3/10 pain before treatment and 3/10 pain at conclusion of treatment.  Pain Location: post op area    OBJECTIVE DATA SUMMARY:      Orders, labs, and chart reviewed on [employer]. Communicated with patient's nurse.     Present illness history:   Patient Active Problem List    Diagnosis Date Noted    Lumbar radiculopathy 12/02/2022      Previous medical history:   Past Medical History:   Diagnosis Date    Arthritis     GENERALIZED OSTEOARTHRITIS    PONV (postoperative nausea and vomiting)     Prolonged emergence from general anesthesia     PVC (premature ventricular contraction) 2017    NOT TREATED    Skin cancer     LEFT EAR, BILATERAL LEG, WRIST  SQUAMOUS & BASAL        PATIENT FOUND:     Semi reclined in bed. (+) IV. (+) spouse present.    COGNITIVE STATUS:     Mental Status:  Oriented x3 and pleasant   Communication:  normal   Follows commands:  follows one step  commands/direction, follows two step commands/direction, follows multi-step simple commands/direction, and follows multi-step complex commands/direction   General cognition:  intact   Safety/Judgement:  good safety awareness       EXTREMITIES ASSESSMENT:      Range Of Motion:  BUE: WFL  BLE: AROM range of motion is WFL.      Strength:    BUE: WFL  RLE: Strength is grossly graded as 5/5  LLE: Strength is grossly graded as 5/5    Sensation:  intact to light touch to light touch at B LE distally         THERAPEUTIC ACTIVITIES; FUNCTIONAL MOBILITY AND BALANCE STATUS:      Bed mobility:  Rolling: contact guard; HOB slightly raised; logroll, good carry over, railing support  Sup->sit: contact guard; HOB slightly raised; logroll, railing  Sit->supine:  not tested; discussed with pt    Transfers:  Sit -> stand: contact guard; elevated bed level, walker, cues   Stand -> sit: contact guard; walker, cues       Gait Training:  80 feet, contact guard  normal gait pattern, steady, decreased but functional cadence, and cueing for  posture, rolling walker, gait belt, LSO    Stair Training:   not tested, but discussed ambulation on 1 step with walker.      Balance:   Static sitting balance:          good       Dynamic sitting balance:     good       Static standing balance:      good      Dynamic standing balance: fair+ and fair           THERAPEUTIC EXERCISES:      reviewed/independent, 10 reps each:  ankle pumps      ACTIVITY TOLERANCE:     - good tolerance to activity during PT session  - motivated to increase activity    FINAL LOCATION:     Seated in bed side chair, all needs within reach. Patient agrees to call for assistance. (+) chair alarm. (+) nurse notified. (+) spouse present.  Pt declined ice pack.    Thank you for this referral.  Nadene Rubins, PT /DPT

## 2022-12-03 NOTE — Progress Notes (Signed)
Progress Note    Patient: Christina Bush   Age:  62 y.o.  DOA: 12/02/2022   Admit Dx / CC: LOS:  LOS: 1 day     Active Problems   1.  Depression  2.  GERD  3.  Degenerative arthritis generalized  4.  Spinal stenosis  5.  Obesity    Plan:   1.  Patient was seen and examined patient was doing fairly well pain tolerable she was able to ambulate.  For evaluation of hyperglycemia hemoglobin A1c was checked which was 5.6.  Patient strongly encouraged to ambulate  2.  Patient is hemodynamically stable to be discharged.  Patient advised to keep follow-up appointment with her PCP and neurosurgery    Subjective:   Feeling better pain tolerable denies any chest pain or shortness of breath  Was able to ambulate with assistance  Objective:   Visit Vitals  BP 105/60   Pulse 86   Temp 98.8 F (37.1 C) (Oral)   Resp 16   Ht 1.727 m (5\' 8" )   Wt 94.6 kg (208 lb 8.9 oz)   SpO2 95%   BMI 31.71 kg/m       Physical Exam:  General appearance: Very pleasant middle-aged female well-built  Sitting comfortably in no acute distress  Lungs: CTA bilaterally  Heart: Regular rhythm, no murmur  Abdomen: soft, non-tender. Bowel sounds Audible,  Extremities: No edema, distal pulses palpable  Skin: Warm and dry  Neurologic: Awake and alert  Answering questions appropriately  Intake and Output:  Current Shift:  No intake/output data recorded.  Last three shifts:  03/19 1901 - 03/21 0700  In: [id number] [P.O.:240; I.V.:1350]  Out: 4170 [Urine:4170]    Lab/Data Reviewed:  Recent Results (from the past 48 hour(s))   Hemoglobin A1C    Collection Time: 12/03/22  6:09 AM   Result Value Ref Range    Hemoglobin A1C 5.6 3.8 - 5.6 %       Imaging:  @[email or web address] @    Medications Reviewed:  Current Facility-Administered Medications   Medication Dose Route Frequency    oyster shell calcium w/D 500-5 MG-MCG tablet 1 tablet  1 tablet Oral Daily    fexofenadine (ALLEGRA) tablet 180 mg  180 mg Oral Daily    QUEtiapine (SEROQUEL XR) extended release tablet 100 mg  100 mg  Oral Daily    lactated ringers IV soln infusion   IntraVENous Continuous    sodium chloride flush 0.9 % injection 5-40 mL  5-40 mL IntraVENous 2 times per day    sodium chloride flush 0.9 % injection 5-40 mL  5-40 mL IntraVENous PRN    0.9 % sodium chloride infusion   IntraVENous PRN    acetaminophen (TYLENOL) tablet 650 mg  650 mg Oral Q6H PRN    oxyCODONE (ROXICODONE) immediate release tablet 5 mg  5 mg Oral Q4H PRN    Or    oxyCODONE (ROXICODONE) immediate release tablet 10 mg  10 mg Oral Q4H PRN    HYDROmorphone (DILAUDID) injection 1 mg  1 mg IntraVENous Q4H PRN    aluminum & magnesium hydroxide-simethicone (MAALOX) 200-200-20 MG/5ML suspension 15 mL  15 mL Oral Q6H PRN    diphenhydrAMINE (BENADRYL) capsule 25 mg  25 mg Oral Q6H PRN    Or    diphenhydrAMINE (BENADRYL) injection 25 mg  25 mg IntraVENous Q6H PRN    ondansetron (ZOFRAN-ODT) disintegrating tablet 4 mg  4 mg Oral Q8H PRN    Or  ondansetron (ZOFRAN) injection 4 mg  4 mg IntraVENous Q6H PRN    bisacodyl (DULCOLAX) suppository 10 mg  10 mg Rectal Daily PRN    sodium phosphate (FLEET) rectal enema 1 enema  1 enema Rectal Daily PRN    cyclobenzaprine (FLEXERIL) tablet 10 mg  10 mg Oral Q8H PRN    benzocaine-menthol (CEPACOL SORE THROAT) lozenge 1 lozenge  1 lozenge Oral Q2H PRN    venlafaxine (EFFEXOR XR) extended release capsule 225 mg  225 mg Oral Daily with breakfast         Dragon medical dictation software was used for portions of this report. Unintended errors may occur.   Elon Spanner, MD  P# [phone]  December 03, 2022

## 2022-12-03 NOTE — Progress Notes (Signed)
OCCUPATIONAL THERAPY EVALUATION   Acknowledge Orders  Time  OT Charge Capture  Rehab Caseload Tracker    [place] AM-PAC "6 Clicks" Daily Activity Inpatient Short Form  -  Patient: Christina Bush [id number] y.o. female)  Room: 2125/2125    Primary Diagnosis: Lumbar stenosis with neurogenic claudication [M48.062]  Lumbar radiculopathy [M54.16]   Procedure(s) (LRB):  LUMBAR LAMINECTOMY; FACETECTOMY L4-5; PEDICLE SCREWS L4-S1; FUSION L4-S1 (N/A) 1 Day Post-Op  Date of Admission: 12/02/2022   Length of Stay:  1 day(s)  Insurance: Payor: BCBS / Plan: BCBS OUT OF STATE / Product Type: *No Product type* /      Date: 12/03/2022  Time In: 1121        Time Out: 1200       Total Minutes: 39   Treatment time: 23 minutes    Isolation:  No active isolations       MDRO: No active infections    Precautions: falls, spinal:  no bending, no lifting, no twisting  " BLT", LSO brace   Ordered weight bearing status: weight bearing as tolerated    Current diet order: ADULT DIET; Regular    Orders, labs, and chart reviewed on [employer]. Communicated with nursing staff. Patient cleared to participate in Occupational Therapy.    ASSESSMENT   Based on the objective data described below, the patient presents with     -  spinal precautions reviewed.  patient, spouse verbalized understanding.  -  decreased ability to do cross leg technique with right leg(s) for lower body ADLs  -  lower body ADLs with minimum assistance  -  all functional mobility tasks with contact guard assistance  -  anticipate once patient has increased activity tolerance and becomes more mobile with nursing & physical therapy, patient will return to their baseline in basic ADLs.  - + motivation to participate in OT to return to prior level of function    Patient is below their functional baseline and will benefit from skilled occupational therapy intervention in the acute care setting to address the above mentioned impairments.    Patient's rehabilitation  potential is considered to be excellent for below stated goals.     Recommendations:  -  Recommend continued occupational therapy during acute stay.     Discharge Recommendations:   -  Home with family/caregiver support    FUNCTIONAL ASSESSMENT  AM-PAC Inpatient Daily Activity Raw Score: 21 (12/03/22 1352)    -  Home:   At this time and based on an AM-PAC score, no further OT is recommended upon discharge due to (i.e. patient at baseline functional status).  Recommend patient returns to prior setting with prior services.    This AMPAC score should be considered in conjunction with interdisciplinary team recommendations to determine the most appropriate discharge setting. Patient's social support, diagnosis, medical stability, and prior level of function should also be taken into consideration.    Equipment Recommendations for Discharge:  -  sock aid     PRIOR LEVEL OF FUNCTION     Information was obtained by: patient, spouse  Home environment: Patient lives with spouse in a 1 story home.   Patient's bathroom has walk in shower and shower chair.   Prior level of function: Patient is independent with basic ADLs and ambulation.  Prior level of Instrumental Activities of Daily Living: Patient reports they do the best they can in IADLs.   Patient does have spouse that assists them.   Home equipment: shower  chair, rolling walker, cane    PLAN      Patient will be followed by occupational therapy to address goals while hospitalized as patient's status and schedule permit.   Patient to be seen 5 days x 4 weeks for Adaptive equipment, ADI training, activity tolerance, functional balance training, functional mobility training, therapeutic exercise, therapeutic activity, patient/caregiver education and training, and neuromuscular re-education     Patient and/or family have participated as able in goal setting and plan of care.  GOALS     OT goals initiated 12/03/2022 and will be met by patient within 10 sessions     -  Patient  will perform lower body ADLs with modified independent AE/DME as needed.  -  Patient will perform toileting tasks with modified independent,  AE/DME as needed.  -  Patient will be modified independent with functional transfers using the least restrictive device.  -  Patient will state/observed spinal precautions with independent to reduce risk of injury.    EDUCATION/COMMUNICATION     Barriers to learning/limitations: None    Education provided [id number] on (+) role of OT, (+) OT plan of care, (+) Instructed patient in the benefits of maintaining activity tolerance, functional mobility, and independence with self care tasks during acute stay  to ensure safe return home and to baseline. Encouraged patient to increase frequency and duration OOB, be out of bed for all meals, perform daily ADLs (as approved by RN/MD regarding bathing etc), and performing functional mobility to/from bathroom with staff assistance as needed., (+) spinal precautions (no "BLT": no bending, no lifting, no twisting) and their impact on functional tasks, (+) logroll technique and positioning, (+)  bathroom safety, home safety.  Reviewed: picture of safe bathroom & sitting area, adaptive equipment use, (+) staff assistance with mobility, (+) change positions frequently, (+) functional mobility, (+) discharge disposition/recommendations, (+) use of ice to assist with pain management    Educational handouts issued: OT and Spine, home safety booklet: bathroom safety, home safety, picture of safe bathroom & sitting area, reacher and sock aid use.     Patient / family response to education: verbalized understanding    SUBJECTIVE     Patient "I have had 3 back surgeries"    Pain Assessment: well managed    OBJECTIVE DATA SUMMARY     Patient was admitted to the hospital on 12/02/2022 with No chief complaint on file.    Present illness history:   Patient Active Problem List    Diagnosis Date Noted    Lumbar radiculopathy 12/02/2022      Previous  medical history:   Past Medical History:   Diagnosis Date    Arthritis     GENERALIZED OSTEOARTHRITIS    PONV (postoperative nausea and vomiting)     Prolonged emergence from general anesthesia     PVC (premature ventricular contraction) 2017    NOT TREATED    Skin cancer     LEFT EAR, BILATERAL LEG, WRIST  SQUAMOUS & BASAL       PATIENT FOUND     Bed    Treatment included: therapeutic activity, functional mobility retraining, ADI retraining, activity tolerance, education for positioning and/or educational instruction during/immediately following OT evaluation    COGNITIVE STATUS     Mental status:  Patient is awake, alert, pleasant and cooperative.  Patient is oriented to person, place, month and year.  Patient demonstrates appropriate eye contact, appropriate casual conversation and is attentive.  Communication: normal, speech and language intact  Attention  Span:  normal  Follows commands: follows multi-step complex commands/direction  Safety/Judgement:  appropriate awareness of environment and need for assistance  Hearing:   grossly intact  Vision:   grossly intact    UPPER EXTREMITY ASSESSMENT     Dominance:right  RIGHT:  ACTIVE range of motion is [facility] Strength is grossly graded as  not formally assessed secondary to spinal surgery and precautions     LEFT:   ACTIVE range of motion is [facility]  Strength is grossly graded as not formally assessed secondary to spinal surgery and precautions           ACTIVITIES OF DAILY LIVING     Based on direct observation, simulation and clinical assessment.  (clincial judgement based on: activity tolerance, balance, safety awareness, cognition, functional strength/ROM/coordination of all extremities)    Eating:           - independent  Grooming:     - independent  UB bathing:   - stand-by assistance  LB bathing:   -  minimum assistance  UB dressing: - stand-by assistance  LB dressing: - minimum assistance  Toileting:       - stand-by assistance    Comment(s):   -  Instructed patient   in the following:    1. Home safety - (I.E remove throw rugs, appropriate height for sitting, remove clutter/clear pathways,recliner safety, change of floor surfaces)   2. Bathroom safety - (I.E. Grab bars, DME options, no slip rugs)  3. AE options if needed.  to increase independence and fall prevention during standing level ADLs and mobility.   -   Patient instructed to don all clothing while seated then stand for over hips. Doff all clothing to knees in standing, then sit to doff clothing from knees to feet in order to facilitate fall prevention, pain management, and energy conservation during lower body ADLs.  Patient was instructed in adaptive equipment options for ADL's, reacher and sock aid, as needed.  -  Instructed patient in the following:  Spine precautions and impact on ADLs.  Cross leg technique for lower body dressing.  Adaptive equipment options for ADL's, reacher and sock aid, as needed.  -  Tub/shower transfer: Patient instructed to use the same technique as with stairs when entering and exiting tub (up with the "stronger" leg, down with the "weaker" leg).   Patient verbalized understanding.   - Pt completed toileting tasks with SBA. Min A required for donning and doffing LSO    FUNCTIONAL MOBILITY STATUS     Mobility:  -  Functional ambulation in room: standby assistance with RW     Transfers:  -  Functional transfers: standby assistance, rolling walker    Comment(s):   -  Instructed patient in proper hand placement technique during sit <->stand:  Push up from the hand rails or sitting surface of the chair/bedside commode/bed when standing.   Always reach back for the hand rails or sitting surface of the chair/bedside commode/bed when sitting.      -  Instructed patient in use of ice to assist with pain management.   -  Instructed patient in logrolling technique for bed mobility.  -  Discussed with patient the following 1. Do not drive while taking a narcotic pain medicine  2. Do not drive until  cleared by your surgeon   3.  Do not be a passenger in a car except to go home from the hospital or to see a physician until cleared by surgeon  -  FX'L mob in room to bathroom, and hallway for household distances with SBA and RW  -  Re-arranged chair in room to allow patient easy access for sitting up, with staff assistance as needed.     BALANCE AND ACTIVITY TOLERANCE     Static Sitting Balance -           good:       maintains balance against moderate resistance  Dynamic Sitting Balance -      good:       independent with functional dynamic balance activities   Static Standing Balance -       good (-):  maintains balance against minimum resistance  Dynamic Standing Balance -  fair (+):    able to perform full UE ROM ranges with SBA/supervision     Activity Tolerance:   - good tolerance to activity during OT session    Comment(s):   -  Instructed patient in the following; 1. Encouraged patient to sit up in chair 45-60 minutes at a time.  2. Alternate between the bed, walking with staff and sitting in the chair to ease surgical pain. 3. The importance of activity while hospitalized to prevent a decline in function.  Recommend the patient get out of bed to chair 2-3 times a day, with assistance as needed.    FINAL LOCATION     Seated in bed side chair, all needs within reach. Patient agrees to call for assistance.  Patient set up with tray table.    Thank you for this referral.    Caroline More, OTR/L  December 03, 2022

## 2022-12-04 LAB — CBC
Hematocrit: 34.4 % — ABNORMAL LOW (ref 35.0–47.0)
Hemoglobin: 11.5 gm/dl (ref 11.0–16.0)
MCH: 32 pg (ref 25.4–34.6)
MCHC: 33.4 gm/dl (ref 30.0–36.0)
MCV: 95.8 fL (ref 80.0–98.0)
MPV: 10.9 fL — ABNORMAL HIGH (ref 6.0–10.0)
Platelets: 167 10*3/uL (ref 140–450)
RBC: 3.59 M/uL — ABNORMAL LOW (ref 3.60–5.20)
RDW: 48.5 — ABNORMAL HIGH (ref 36.4–46.3)
WBC: 6.7 10*3/uL (ref 4.0–11.0)

## 2022-12-04 MED ORDER — OXYCODONE HCL 5 MG PO TABS
5 | ORAL_TABLET | ORAL | 0 refills | Status: AC | PRN
Start: 2022-12-04 — End: 2022-12-07

## 2022-12-04 MED ORDER — CYCLOBENZAPRINE HCL 10 MG PO TABS
10 | ORAL_TABLET | Freq: Three times a day (TID) | ORAL | 0 refills | Status: AC | PRN
Start: 2022-12-04 — End: 2022-12-14

## 2022-12-04 MED FILL — ACETAMINOPHEN 325 MG PO TABS: 325 MG | ORAL | Qty: 2

## 2022-12-04 MED FILL — VENLAFAXINE HCL ER 37.5 MG PO CP24: 37.5 MG | ORAL | Qty: 2

## 2022-12-04 MED FILL — QUETIAPINE FUMARATE ER 50 MG PO TB24: 50 MG | ORAL | Qty: 2

## 2022-12-04 MED FILL — SODIUM CHLORIDE FLUSH 0.9 % IV SOLN: 0.9 % | INTRAVENOUS | Qty: 10

## 2022-12-04 MED FILL — CYCLOBENZAPRINE HCL 10 MG PO TABS: 10 MG | ORAL | Qty: 1

## 2022-12-04 MED FILL — FEXOFENADINE HCL 180 MG PO TABS: 180 MG | ORAL | Qty: 1

## 2022-12-04 MED FILL — OYSTER SHELL CALCIUM/D3 500-5 MG-MCG PO TABS: 500-5 MG-MCG | ORAL | Qty: 1

## 2022-12-04 MED FILL — OXYCODONE HCL 5 MG PO TABS: 5 MG | ORAL | Qty: 2

## 2022-12-04 NOTE — Discharge Summary (Signed)
Discharge Summary    Patient: Christina Bush               Sex: female          DOA: 12/02/2022         Date of Birth:  16-Aug-1961      Age:  62 y.o.        LOS:  LOS: 2 days           Admit Date: 12/02/2022    Discharge Date: 12/04/2022    Consults: Internal Medicine    Admission Diagnoses: Lumbar stenosis with neurogenic claudication [M48.062]  Lumbar radiculopathy [M54.16]    Discharge Diagnoses:      Allergies   Allergen Reactions    Morphine And Related Nausea And Vomiting          Operative Procedure Performed: Procedure(s):  LUMBAR LAMINECTOMY; FACETECTOMY L4-5; PEDICLE SCREWS L4-S1; FUSION L4-S1         HPI:  Ms. Christina Bush  is a 62 y.o.  followed by Dr. Joseph Berkshire as an outpatient.    Hospital Course: Ms. Christina Bush was admitted to the hospital on 12/02/2022  6:04 AM .  The patient underwent the above named procedure.  Remained hemodynamically stable during their hospitalization.  Received appropriate pre and post operative prophylactic antibiotics as well as SCDs.     Surgical incision site remained clean dry and intact, flat , no hematoma or s/s of infection.    Pain was controlled with prn analgesics     Discharge Medications:     Current Discharge Medication List        START taking these medications    Details   cyclobenzaprine (FLEXERIL) 10 MG tablet Take 1 tablet by mouth every 8 hours as needed for Muscle spasms  Qty: 30 tablet, Refills: 0    Associated Diagnoses: Lumbar radiculopathy      oxyCODONE (ROXICODONE) 5 MG immediate release tablet Take 1 tablet by mouth every 4 hours as needed for Pain for up to 3 days. Max Daily Amount: 30 mg  Qty: 30 tablet, Refills: 0    Comments: Reduce doses taken as pain becomes manageable.  Lorita Officer MD attending.  Associated Diagnoses: Lumbar radiculopathy           CONTINUE these medications which have NOT CHANGED    Details   aspirin 81 MG EC tablet Take 1 tablet by mouth daily      clobetasol (TEMOVATE) 0.05 % external solution 2 times daily  as needed      venlafaxine 225 MG extended release tablet Take 1 tablet by mouth daily      calcium carb-cholecalciferol (CALCIUM 600+D) 600-20 MG-MCG TABS Take by mouth daily      estradiol (ESTRACE) 0.1 MG/GM vaginal cream every 7 days      fexofenadine (ALLEGRA) 180 MG tablet Take 1 tablet by mouth daily      lansoprazole (PREVACID) 30 MG delayed release capsule Take by mouth daily      QUEtiapine (SEROQUEL XR) 50 MG extended release tablet 2 tablets daily                   Discharge Instructions: Discussed with patient.        Discharge Disposition:  Patient is medically stable for discharge home from hospital with above discharge medications, instructions, and follow up care. Discharge assessement and plan made with Dr. Terrilee Files Benjie Ricketson, PA-C  12/04/2022  6:56 AM        CC:  PCP: Unknown, Provider, DO

## 2022-12-04 NOTE — Progress Notes (Signed)
Progress Note    Patient: Christina Bush   Age:  62 y.o.  DOA: 12/02/2022   Admit Dx / CC: LOS:  LOS: 2 days     Active Problems   1.  Depression  2.  GERD  3.  Degenerative arthritis generalized  4.  Spinal stenosis  5.  Obesity    Plan:   1.  Patient was seen and examined patient was doing fairly well pain tolerable she was able to ambulate.  Patient is scheduled to be discharged home today.  Patient strongly encouraged to ambulate several times a day  2.  Patient advised to keep follow-up appointment with her primary care provider and neurosurgery  3.  Patient was told if her condition gets worse she starts running fever  Develop some cough or shortness of breath she can call her primary care provider or come to the emergency room  4.  Patient was discussed with RN.  Patient is medically stable to be discharged from  Subjective:   Feeling better pain tolerable was able to ambulate  Objective:   Visit Vitals  BP (!) 142/73   Pulse 76   Temp 99.1 F (37.3 C) (Oral)   Resp 17   Ht 1.72 m (5' 7.72")   Wt 93 kg (205 lb 0.4 oz)   SpO2 96%   BMI 31.44 kg/m       Physical Exam:  General appearance: Very pleasant middle-aged female well-built  Sitting comfortably in no acute distress  Lungs: CTA bilaterally  Heart: Regular rhythm, no murmur  Abdomen: soft, non-tender. Bowel sounds Audible,  Extremities: No edema, distal pulses palpable  Skin: Warm and dry, dressing intact over lower back  Neurologic: Awake and alert  Answering questions appropriately  Able to move all 4 extremities  Intake and Output:  Current Shift:  03/22 0701 - 03/22 1900  In: 500 [P.O.:500]  Out: 1 [Urine:1]  Last three shifts:  03/20 1901 - 03/22 0700  In: 650 [P.O.:650]  Out: 1850 [Urine:1850]    Lab/Data Reviewed:  Recent Results (from the past 48 hour(s))   Hemoglobin A1C    Collection Time: 12/03/22  6:09 AM   Result Value Ref Range    Hemoglobin A1C 5.6 3.8 - 5.6 %   CBC    Collection Time: 12/04/22  6:20 AM   Result Value Ref Range    WBC  6.7 4.0 - 11.0 1000/mm3    RBC 3.59 (L) 3.60 - 5.20 M/uL    Hemoglobin 11.5 11.0 - 16.0 gm/dl    Hematocrit 34.4 (L) 35.0 - 47.0 %    MCV 95.8 80.0 - 98.0 fL    MCH 32.0 25.4 - 34.6 pg    MCHC 33.4 30.0 - 36.0 gm/dl    Platelets 167 140 - 450 1000/mm3    MPV 10.9 (H) 6.0 - 10.0 fL    RDW 48.5 (H) 36.4 - 46.3         Imaging:  @[email or web address] @    Medications Reviewed:  No current facility-administered medications for this encounter.     Current Outpatient Medications   Medication Sig    cyclobenzaprine (FLEXERIL) 10 MG tablet Take 1 tablet by mouth every 8 hours as needed for Muscle spasms    oxyCODONE (ROXICODONE) 5 MG immediate release tablet Take 1 tablet by mouth every 4 hours as needed for Pain for up to 3 days. Max Daily Amount: 30 mg    aspirin 81 MG EC tablet Take 1 tablet  by mouth daily    clobetasol (TEMOVATE) 0.05 % external solution 2 times daily as needed    venlafaxine 225 MG extended release tablet Take 1 tablet by mouth daily    calcium carb-cholecalciferol (CALCIUM 600+D) 600-20 MG-MCG TABS Take by mouth daily    estradiol (ESTRACE) 0.1 MG/GM vaginal cream every 7 days    fexofenadine (ALLEGRA) 180 MG tablet Take 1 tablet by mouth daily    lansoprazole (PREVACID) 30 MG delayed release capsule Take by mouth daily    QUEtiapine (SEROQUEL XR) 50 MG extended release tablet 2 tablets daily         Dragon medical dictation software was used for portions of this report. Unintended errors may occur.   Elon Spanner, MD  P# [phone]  December 04, 2022

## 2022-12-04 NOTE — Progress Notes (Signed)
Progress Note      Patient: Christina Bush               Sex: female          DOA: 12/02/2022         Date of Birth:  02/05/1961      Age:  62 y.o.         LOS: 2 days               Subjective:     Patient seen and evaluated this am.  Doing well.  No new complaints  C/O incisional pain    Objective:      Visit Vitals  BP 138/69   Pulse 79   Temp 97.5 F (36.4 C) (Temporal)   Resp 16   Ht 1.727 m (5\' 8" )   Wt 94.6 kg (208 lb 8.9 oz)   SpO2 94%   BMI 31.71 kg/m         Physical Exam:  Normal sensory and motor exam  Wound/dressing C/D/I  Afebrile        Assessment/Plan     Principal Problem:    Lumbar radiculopathy  Resolved Problems:    * No resolved hospital problems. *      Discharge home today.  Oxycodone and Flexeril given.  Instructions given.  Patient to follow up with Dr Joseph Berkshire in 2 weeks [phone].

## 2022-12-04 NOTE — Progress Notes (Signed)
OCCUPATIONAL THERAPY TREATMENT    Time  OT Charge Capture  Rehab Caseload Tracker   [employer] AM-PAC "6 Clicks" Daily Activity Inpatient Short Form   -    Patient: Christina Bush [id number] y.o. female)  Room: 2125/2125    Primary Diagnosis: Lumbar stenosis with neurogenic claudication [M48.062]  Lumbar radiculopathy [M54.16]   Procedure(s) (LRB):  LUMBAR LAMINECTOMY; FACETECTOMY L4-5; PEDICLE SCREWS L4-S1; FUSION L4-S1 (N/A) 2 Days Post-Op  Date of Admission: 12/02/2022   Length of Stay:  2 day(s)  Insurance: Payor: BCBS / Plan: BCBS OUT OF STATE / Product Type: *No Product type* /      Date: 12/04/2022  Time In: 0910        Time Out: 0939       Total Minutes: 29       Isolation:  No active isolations       MDRO: No active infections    Precautions: falls, spinal:  no bending, no lifting, no twisting  " BLT", LSO brace  Ordered weight bearing status: weight bearing as tolerated    Current diet order: ADULT DIET; Regular    ASSESSMENT     Based on the objective data described below, the patient presents with       Pt progressing well with POC  Demonstrates good awareness of spinal precautions  Performed LB dressing tasks with use of AE and SBA  Sit to stand with SBA/Mod I  Stand at sink during oral care    Recommendations:  Recommend continued skilled occupational therapy intervention to address above impairments.  Recommend out of bed activity to counteract ill effects of bedrest, with assistance from staff as needed.    Discharge Recommendations:   -  Patient plans on returning home with family    Equipment Recommendations for Discharge:   - Sock aid recommended at eval, none currently available here, ed pt on purchase     PLAN     Patient will continue to benefit from skilled OT services to attain remaining goals    COMMUNICATION/EDUCATION     Barriers to learning/limitations: None    Education provided [id number] on (+) role of OT, (+) OT plan of care, (+) staff assistance with mobility, (+) ADL training,  (+) adaptive equipment use    Educational handouts issued: none this session    Patient / family response to education: verbalized and demonstrated understanding    SUBJECTIVE     Patient "I hope this helps me to not feel like I'm 89 every morning when I wake up".    Pain assessment: well managed    OBJECTIVE DATA SUMMARY     Orders, labs, occupational therapy and chart reviewed on [employer]. Communicated with nursing staff. Patient cleared to participate in Occupational Therapy treatment.    PATIENT FOUND     Seated In bedside chair, all needs in reach    COGNITIVE STATUS     - Patient is awake, alert, pleasant and cooperative.  Patient is oriented to person, place, month and year.  Patient demonstrates appropriate eye contact, appropriate casual conversation and is attentive.    ACTIVITES OF DAILY LIVING     LB dressing-  Set up assist Pt with improved ability to perform LB dressing tech using figure 4 tech  UB dressing- I  Pt declined bathing- stating would use shower chair and has long bathing tool at home    MOBILITY     -  Sit to stand -  standby  assistance  -  Stand to sit -  standby assistance    Transfers:  -  Functional transfers: standby assistance, rolling walker    BALANCE AND ACTIVITY TOLERANCE     Balance:  -  Sitting Balance: good  -  Standing Balance:  good  -  Sitting Tolerance:  2 hours  -  Standing Tolerance:  5 minutes    Activity Tolerance:   - good tolerance to activity during OT session    Vitals:   Not assessed, non symptomatic during session    THERAPEUTIC EXERCISE/ACTIVITY     Therapeutic Exercises:   N/A    Therapeutic Activity:  Pt demo and verbalized understanding spinal precuations ,use of ECT, and shower safety to allow increased safety and I with ADL routine upon return to home.     FINAL LOCATION     Patient seated in bedside chair, all needs within reach    Rosine Beat, OTA  December 04, 2022

## 2022-12-04 NOTE — Discharge Instructions (Signed)
[facility]  [address]  [place] [address]    Phone:     [phone]  Fax:         [phone]  David C. Waters, MD           Grant A. Skidmore, MD   Joseph L. Koen, MD               Ardell Aaronson PA-C    Discharge Instructions  With your recent surgery it is not unusual to experience some pain or discomfort in the  area of previous pain or the incision.  Accordingly, you have been given pain medication for your comfort until the healing process is further along.  As time progresses, you should notice the frequency and the intensity of your discomfort steadily decrease.  Here are some simple post operative instructions to help during your home convalescence.  Use your pain medication as directed.  Refills should be requested during regular office hours and not on weekends.    Continue any regular medicine for other conditions (e.g. blood pressure, diabetes, heart problems, etc.).    No driving or riding in a car for four (4) weeks except for emergencies or doctor's appointments.    No bending, lifting greater than 5 lbs, or strenuous activities.  If you need to get to the floor, squat instead of bend.    No tub baths.  Showers are fine after surgery, but make sure to towel dry the incision.  A chair may be used in the shower so that you can sit if needed.    Avoid exercises except for walking.  Begin with frequent, but short, periods of walking and gradually build up to longer periods of time.    Sit for short periods of time (10-15 minutes) in the first week after surgery.    You may resume sexual relations as comfort level allows.    If a cervical collar has been prescribed, wear the cervical collar at all times except when showering.    If a lumbar brace has been prescribed wear it when up walking greater than 15 minutes.   No need to wear while sitting.      Notify us if you develop fever, painful redness around the incision or drainage from the wound.    Call and  schedule you follow-up appointment with your doctor @ [phone].    If you have any other questions, please contact our office at [phone].    Thank you!

## 2022-12-04 NOTE — Telephone Encounter (Addendum)
----------  [id number] (AP)-------------------------------------------              [facility]                       Patient Education Report         Name: KEWANNA, BRADEN                  Date: 12/03/2022    MRN: [id number]                    Time: 10:10:11 AM         Patient ordered video: 'Patient Safety: Stay Safe While you are in the Hospital'    from [email or web address] via phone number: [phone] at 10:10:11 AM    Description: This program outlines some of the precautions patients can take to ensure a speedy recovery without extra complications. The video emphasizes the importance of communicating with the healthcare team.    ----------DocumentID: [id number] (AP)-------------------------------------------                       [facility]          Patient Education Report - Discharge Summary        Date: 12/04/2022   Time: 10:48:24 AM   Name: TAKEKO, MCNICOL   MRN: [id number]      Account Number: [id number]      Education History:        Patient ordered video: 'Patient Safety: Stay Safe While you are in the Hospital' from [id number] on 12/03/2022 10:10:11 AM
